# Patient Record
Sex: Female | Born: 1990 | Race: Black or African American | Hispanic: No | Marital: Single | Smoking: Never smoker
Health system: Southern US, Community
[De-identification: ages and names within clinical notes are randomized; demographics above are authoritative.]

## PROBLEM LIST (undated history)

## (undated) DIAGNOSIS — I10 Essential (primary) hypertension: Secondary | ICD-10-CM

## (undated) DIAGNOSIS — D649 Anemia, unspecified: Secondary | ICD-10-CM

## (undated) HISTORY — DX: Essential (primary) hypertension: I10

## (undated) HISTORY — DX: Anemia, unspecified: D64.9

---
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
## 2010-01-24 ENCOUNTER — Ambulatory Visit: Payer: Self-pay | Admitting: Obstetrics and Gynecology

## 2010-01-24 ENCOUNTER — Inpatient Hospital Stay (HOSPITAL_COMMUNITY): Admission: AD | Admit: 2010-01-24 | Discharge: 2010-01-24 | Payer: Self-pay | Admitting: Obstetrics

## 2010-06-01 ENCOUNTER — Emergency Department (HOSPITAL_COMMUNITY): Admission: EM | Admit: 2010-06-01 | Discharge: 2010-06-01 | Payer: Self-pay | Source: Home / Self Care

## 2010-06-19 ENCOUNTER — Inpatient Hospital Stay (HOSPITAL_COMMUNITY)
Admission: AD | Admit: 2010-06-19 | Discharge: 2010-06-24 | DRG: 765 | Disposition: A | Discharge status: Home / Self Care | Payer: Medicaid Other | Source: Ambulatory Visit | Attending: Obstetrics | Admitting: Obstetrics

## 2010-06-19 DIAGNOSIS — O480 Post-term pregnancy: Principal | ICD-10-CM | POA: Diagnosis present

## 2010-06-19 LAB — RPR: RPR Ser Ql: NONREACTIVE

## 2010-06-19 LAB — CBC
HCT: 36 % (ref 36.0–46.0)
MCH: 25.2 pg — ABNORMAL LOW (ref 26.0–34.0)
Platelets: 181 10*3/uL (ref 150–400)
RBC: 4.57 MIL/uL (ref 3.87–5.11)

## 2010-06-21 ENCOUNTER — Other Ambulatory Visit: Payer: Self-pay | Admitting: Obstetrics

## 2010-06-22 LAB — CBC
HCT: 25.4 % — ABNORMAL LOW (ref 36.0–46.0)
MCH: 25.8 pg — ABNORMAL LOW (ref 26.0–34.0)
MCHC: 33.1 g/dL (ref 30.0–36.0)
Platelets: 121 10*3/uL — ABNORMAL LOW (ref 150–400)
RDW: 14.5 % (ref 11.5–15.5)
WBC: 10 10*3/uL (ref 4.0–10.5)

## 2010-06-23 NOTE — Op Note (Signed)
[name], [name]               ACCOUNT NO.:  [id number]  MEDICAL RECORD NO.:  [id number]           PATIENT TYPE:  I  LOCATION:  9132                          [facility]:  [facility]  PHYSICIAN:  Charles A. Clearance Coots, M.D.DATE OF BIRTH:  March 11, 1991  DATE OF PROCEDURE:  06/21/2010 DATE OF DISCHARGE:                              OPERATIVE REPORT   PREOPERATIVE DIAGNOSES:  Postdates, induction of labor, and arrest of dilatation.  POSTOPERATIVE DIAGNOSES:  Postdates, induction of labor, and arrest of dilatation.  PROCEDURE:  Primary low transverse cesarean section.  SURGEON:  Charles A. Clearance Coots, M.D.  ANESTHESIA:  Epidural.  ESTIMATED BLOOD LOSS:  1000 mL.  IV FLUIDS:  2000 mL.  URINE:  100 mL.  COMPLICATIONS:  None.  DRAINS:  Foley to gravity.  FINDINGS:  Viable female at 1648, Apgars of 9 at one minute and 10 at five minutes, weight of 9 pounds 3 ounces.  Normal uterus, ovaries, and fallopian tubes.  SPECIMEN:  Placenta.  DISPOSITION OF SPECIMEN:  Pathology.  OPERATION:  The patient was brought to the operating room, and after satisfactory redosing of the epidural, the abdomen was prepped and draped in the usual sterile fashion.  A Pfannenstiel skin incision was made with a scalpel that was deepened down to the fascia with the scalpel.  Fascia was nicked in the midline, and the fascial incision was extended to the left and to the right with curved Mayo scissors.  The superior and inferior fascial edges were taken off the rectus muscle with both blunt and sharp dissection.  Rectus muscle was sharply divided in the midline, being careful to avoid the urinary bladder inferiorly. The peritoneum was entered digitally and was digitally extended to the left and to the right.  The Alexis retractor was then placed in the incision.  The vesicouterine fold of peritoneum above the reflection of the urinary bladder was grasped with forceps and was incised and undermined with Metzenbaum  scissors.  The incision was extended to the left and to the right with Metzenbaum scissors.  The bladder flap was bluntly developed.  The uterus was then entered transversely in the lower uterine segment with a scalpel.  Clear amniotic fluid was expelled.  The uterine incision was extended to the left and to the right with the bandage scissors.  The vertex was noted to be left occipitotransverse.  The occiput was rotated anteriorly to the incision, and the vertex was then delivered with the aid of fundal pressure from the assistant.  The infant's mouth and nose were suctioned with a suction bulb, and the delivery was completed with the aid of fundal pressure from the assistant.  The umbilical cord was doubly clamped and cut, and the infant was handed off to the nursery staff.  Cord blood was obtained, and the placenta was spontaneously expelled from the uterine cavity intact.  The endometrial surface was thoroughly debrided with a dry lap sponge.  The edges of the uterine incision were grasped with ring forceps.  The uterus was closed with a continuous interlocking suture of 0 Monocryl from each corner to the center.  A posterior extension  was also closed with continuous interlocking suture of 0 Monocryl.  Hemostasis was excellent.  Pelvic cavity was thoroughly irrigated with warm saline solution, and all clots were removed.  The abdomen was then closed as follows:  The parietal peritoneum was closed with a continuous suture of 2-0 Monocryl.  The fascia was closed with a continuous suture of 0 Vicryl.  Subcutaneous tissue was thoroughly irrigated with warm saline solution.  All areas of subcutaneous bleeding were coagulated with Bovie.  Skin was then closed with surgical stainless steel staples.  The surgical technician indicated that all needle, sponge, and instrument counts were correct x2.  The patient tolerated the procedure well, was transported to the recovery room in satisfactory  condition.     Charles A. Clearance Coots, M.D.     CAH/MEDQ  D:  06/21/2010  T:  06/22/2010  Job:  [id number]  Electronically Signed by Coral Ceo M.D. on 06/23/2010 09:55:54 AM

## 2010-06-25 NOTE — Discharge Summary (Signed)
  NAMETHULA, STEWART NO.:  [id number]  MEDICAL RECORD NO.:  [id number]           PATIENT TYPE:  I  LOCATION:  9132                          [facility]  PHYSICIAN:  Kathreen Cosier, M.D.DATE OF BIRTH:  1990-10-17  DATE OF ADMISSION:  06/19/2010 DATE OF DISCHARGE:  06/24/2010                              DISCHARGE SUMMARY   The patient is a 20 year old primigravida, [facility] June 13, 2010.  She was brought in for induction at 41 weeks.  Cervix of fingertip, 70%, vertex -2, -3.  She received Cytotec overnight on the night of admission of June 19, 2010, and on June 20, 2010, her cervix was 1 cm, 80%, vertex -2 to 3.  Membranes were ruptured artificially.  Her fluid was clear.  An IUPC was inserted and she was started on Pitocin.  The patient progressed slowly and then stopped progressing at 7 cm and at 0 station.  She underwent a primary low transverse cesarean section on June 21, 2010, for failure to progress in labor and she had a female, Apgars 8 and 9, weighing 9 pounds 3 ounces.  Postoperatively, the patient did well.  Her RPR was negative.  HIV negative and her hemoglobin was 8.4 postop.  She was discharged on the third postoperative day, ambulatory, on a regular diet, on Tylox for pain to see me in 6 weeks.  DISCHARGE DIAGNOSIS:  Status post primary low transverse cesarean section at 41 weeks for failure to progress in labor.          ______________________________ Kathreen Cosier, M.D.     BAM/MEDQ  D:  06/24/2010  T:  06/24/2010  Job:  [id number]  Electronically Signed by Francoise Ceo M.D. on 06/25/2010 08:16:01 AM

## 2010-07-01 ENCOUNTER — Inpatient Hospital Stay (HOSPITAL_COMMUNITY)
Admission: AD | Admit: 2010-07-01 | Discharge: 2010-07-01 | Disposition: A | Discharge status: Home / Self Care | Payer: Medicaid Other | Source: Ambulatory Visit | Attending: Obstetrics | Admitting: Obstetrics

## 2010-07-01 DIAGNOSIS — O909 Complication of the puerperium, unspecified: Secondary | ICD-10-CM | POA: Insufficient documentation

## 2010-07-17 LAB — BASIC METABOLIC PANEL
Calcium: 8.5 mg/dL (ref 8.4–10.5)
Potassium: 2.9 mEq/L — ABNORMAL LOW (ref 3.5–5.1)
Sodium: 135 mEq/L (ref 135–145)

## 2010-07-17 LAB — CBC
HCT: 32.5 % — ABNORMAL LOW (ref 36.0–46.0)
Hemoglobin: 10.8 g/dL — ABNORMAL LOW (ref 12.0–15.0)
RBC: 3.92 MIL/uL (ref 3.87–5.11)
RDW: 13.5 % (ref 11.5–15.5)
WBC: 6.8 10*3/uL (ref 4.0–10.5)

## 2011-01-03 ENCOUNTER — Ambulatory Visit (INDEPENDENT_AMBULATORY_CARE_PROVIDER_SITE_OTHER): Payer: Medicaid Other

## 2011-01-03 ENCOUNTER — Inpatient Hospital Stay (INDEPENDENT_AMBULATORY_CARE_PROVIDER_SITE_OTHER)
Admission: RE | Admit: 2011-01-03 | Discharge: 2011-01-03 | Disposition: A | Discharge status: Home / Self Care | Payer: Medicaid Other | Source: Ambulatory Visit | Attending: Emergency Medicine | Admitting: Emergency Medicine

## 2011-01-03 DIAGNOSIS — R071 Chest pain on breathing: Secondary | ICD-10-CM

## 2012-02-29 ENCOUNTER — Other Ambulatory Visit: Payer: Self-pay

## 2012-02-29 ENCOUNTER — Encounter (HOSPITAL_COMMUNITY): Payer: Self-pay | Admitting: Emergency Medicine

## 2012-02-29 ENCOUNTER — Emergency Department (HOSPITAL_COMMUNITY)
Admission: EM | Admit: 2012-02-29 | Discharge: 2012-02-29 | Disposition: A | Discharge status: Home / Self Care | Payer: Self-pay | Source: Home / Self Care | Attending: Emergency Medicine | Admitting: Emergency Medicine

## 2012-02-29 ENCOUNTER — Emergency Department (INDEPENDENT_AMBULATORY_CARE_PROVIDER_SITE_OTHER): Payer: Medicaid Other

## 2012-02-29 DIAGNOSIS — K2100 Gastro-esophageal reflux disease with esophagitis, without bleeding: Secondary | ICD-10-CM

## 2012-02-29 MED ORDER — OMEPRAZOLE 40 MG PO CPDR: 40.0000 mg | DELAYED_RELEASE_CAPSULE | Freq: Two times a day (BID) | ORAL | Status: DC

## 2012-02-29 NOTE — ED Notes (Addendum)
MD at bedside. 

## 2012-02-29 NOTE — ED Notes (Signed)
CHILD OF PATIENT IS BEING SEEN BY PHYSICIAN AS WELL

## 2012-02-29 NOTE — ED Notes (Signed)
REPORTS CENTER CHEST PAIN FOR ONE YEAR, PAIN HAS GONE AWAY DURING THAT TIME, BUT HAS REOCCURRED IN THE LAST MONTH.  PATIENT REPORTS PAIN LIKE SHE HAS BEEN SEEN FOR IN THE PAST

## 2012-02-29 NOTE — ED Provider Notes (Signed)
Chief Complaint  Patient presents with  . Chest Pain    History of Present Illness:   [name] is a 21 year old female who presents today with a one-year history of substernal chest pain. She was seen here for this a year ago for this same pain and an EKG was done. She was prescribed nonsteroidal anti-inflammatories for possible musculoskeletal pain. She did not take the medications and she was breast-feeding. The pain went away on its own after several months however. It came back about a month ago. The pain is constant and daily. It's localized to the substernal area without radiation. It is worse if she drinks a glass of wine, but also with walking and with breathing. She tried some Maalox and this did seem to help. She feels mildly short of breath at times. She denies any nausea or diaphoresis. She has had no fever or chills. No coughing, wheezing, or asthma history. No cardiac history, palpitations, dizziness, or syncope. No leg swelling or pain. She denies any indigestion, heartburn, waterbrash, or abdominal pain. The patient states she drinks a good bit of coffee at work and occasional alcohol. She does not use tobacco. She states she eats habanero peppers everyday.  Review of Systems:  Other than noted above, the patient denies any of the following symptoms. Systemic:  No fever, chills, sweats, or fatigue. ENT:  No nasal congestion, rhinorrhea, or sore throat. Pulmonary:  No cough, wheezing, shortness of breath, sputum production, hemoptysis. Cardiac:  No palpitations, rapid heartbeat, dizziness, presyncope or syncope. GI:  No abdominal pain, heartburn, nausea, or vomiting. Ext:  No leg pain or swelling.  PMFSH:  Past medical history, family history, social history, meds, and allergies were reviewed and updated as needed.   Physical Exam:   Vital signs:  BP 125/77  Pulse 80  Temp 98.6 F (37 C) (Oral)  Resp 16  SpO2 100%  LMP 02/19/2012 Gen:  Alert, oriented, in no distress, skin warm  and dry. Eye:  PERRL, lids and conjunctivas normal.  Sclera non-icteric. ENT:  Mucous membranes moist, pharynx clear. Neck:  Supple, no adenopathy or tenderness.  No JVD. Lungs:  Clear to auscultation, no wheezes, rales or rhonchi.  No respiratory distress. Heart:  Regular rhythm.  No gallops, murmers, clicks or rubs. Chest:  No chest wall tenderness. Abdomen:  Soft, nontender, no organomegaly or mass.  Bowel sounds normal.  No pulsatile abdominal mass or bruit. Ext:  No edema.  No calf tenderness and Homann's sign negative.  Pulses full and equal. Skin:  Warm and dry.  No rash.  Radiology:  Dg Chest 2 View  02/29/2012  *RADIOLOGY REPORT*  Clinical Data: Chest pain  CHEST - 2 VIEW  Comparison: 01/03/2011  Findings: Heart size is at the upper limits of normal.  Mediastinal shadows are normal.  Lungs are clear.  No effusions.  No bony abnormalities.  IMPRESSION: Normal chest   Original Report Authenticated By: Thomasenia Sales, M.D.    I reviewed the images independently and personally and concur with the radiologist's findings.  EKG:   Date: 02/29/2012  Rate: 74  Rhythm: normal sinus rhythm  QRS Axis: normal  Intervals: normal  ST/T Wave abnormalities: normal  Conduction Disutrbances:none  Narrative Interpretation: Normal sinus rhythm, normal EKG  Old EKG Reviewed: unchanged   Medications given in U[facility]:  She is not having any pain right now, so I did not give her any GI cocktail.  Assessment:  The encounter diagnosis was Reflux esophagitis.   Plan:  1.  The following meds were prescribed:   New Prescriptions   OMEPRAZOLE (PRILOSEC) 40 MG CAPSULE    Take 1 capsule (40 mg total) by mouth 2 (two) times daily before a meal.   2.  The patient was instructed in symptomatic care and handouts were given. She was instructed in an anti-reflux diet and is to followup with her primary care physician in 2 weeks. 3.  The patient was told to return if becoming worse in any way, if no better in 3  or 4 days, and given some red flag symptoms that would indicate earlier return.    Reuben Likes, MD 02/29/12 1352

## 2012-09-08 ENCOUNTER — Telehealth: Payer: Self-pay

## 2012-09-08 NOTE — Telephone Encounter (Signed)
Has not been seen for this, has never been seen here, not our patient. She is coming in for this. Advised we will be happy to see her.

## 2012-09-08 NOTE — Telephone Encounter (Signed)
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

## 2013-07-18 ENCOUNTER — Ambulatory Visit (INDEPENDENT_AMBULATORY_CARE_PROVIDER_SITE_OTHER): Payer: 59 | Admitting: Family Medicine

## 2013-07-18 ENCOUNTER — Ambulatory Visit: Payer: 59

## 2013-07-18 VITALS — BP 114/68 | HR 91 | Temp 98.4°F | Resp 17 | Ht 63.5 in | Wt 195.0 lb

## 2013-07-18 DIAGNOSIS — R7611 Nonspecific reaction to tuberculin skin test without active tuberculosis: Secondary | ICD-10-CM

## 2013-07-18 DIAGNOSIS — Z3200 Encounter for pregnancy test, result unknown: Secondary | ICD-10-CM

## 2013-07-18 LAB — POCT URINE PREGNANCY: PREG TEST UR: NEGATIVE

## 2013-07-18 NOTE — Patient Instructions (Signed)
1.  [facility] for follow-up of positive Tb skin testing.

## 2013-07-18 NOTE — Progress Notes (Addendum)
Subjective:  This chart was scribed for Maureen SimmerKristi Smith, MD by [name], Medical Scribe. This patient was seen in Room  11 and the patient's care was started at 7:36 PM.   Patient ID: [name], female    DOB: 04/05/1991, 23 y.o.   MRN: [id number]  HPI HPI Comments: [name] is a 23 y.o. 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  The patient denies cough, diaphoresis, and unexplained weight loss as associated symptoms.  She states that she was born in [place] and has never lived with anyone with TB.  The patient denies having a history of chronic health problems.  She states that she will get a check-up once a year if she is sexually active.  She states that she has a history of 2 cesarian sections and has regular periods.  The patient states that her last period was June 19, 2013.  She denies using any birth control.  She states that her parents are healthy.  The patient states that she has been in the [place] since [place].  She states that she has been married for two years and has one child who is 303 years of age.  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  The patient states that she is going to be working for [employer].  She denies any tobacco or drug use.  She states that she will drink red wine on occasion.      History reviewed. No pertinent past medical history. Past Surgical History  Procedure Laterality Date   Cesarean section     History reviewed. No pertinent family history. History   Social History   Marital Status: Single    Spouse Name: N/A    Number of Children: N/A   Years of Education: N/A   Occupational History   Not on file.   Social History Main Topics   Smoking status: Never Smoker    Smokeless tobacco: Not on file     Alcohol Use: Yes   Drug Use: No   Sexual Activity: Yes    Birth Control/ Protection: None   Other Topics Concern   Not on file   Social History Narrative   Marital status: married x 2 years; moved to [place] from [place].        Children;  One child (3yo)      Lives:  With husband, son.      Employment:  CMA school; works for [employer].      Tobacco: none       Alcohol: wine on weekends      Drugs:  none   No Known Allergies  Review of Systems  Constitutional: Negative for diaphoresis and unexpected weight change.  Respiratory: Negative for cough.      Objective:  Physical Exam  Nursing note and vitals reviewed. Constitutional: She is oriented to person, place, and time. She appears well-developed and well-nourished. No distress.  HENT:  Head: Normocephalic and atraumatic.  Eyes: EOM are normal.  Neck: Neck supple. No tracheal deviation present.  Cardiovascular: Normal rate, regular rhythm and normal heart sounds.  Exam reveals no gallop and no friction rub.   No murmur heard. Pulmonary/Chest: Effort normal and  breath sounds normal. No respiratory distress. She has no wheezes. She has no rales.  Musculoskeletal: Normal range of motion.  Neurological: She is alert and oriented to person, place, and time.  Skin: Skin is warm and dry.  Psychiatric: She has a normal mood and affect. Her behavior is normal.    Results for orders placed or performed in visit on 07/18/13  POCT urine pregnancy  Result Value Ref Range   Preg Test, Ur Negative     [facility] reading (PRIMARY) by  Dr. Katrinka Blazing.  CXR: NAD.    BP 114/68 mmHg   Pulse 91   Temp(Src) 98.4 F (36.9 C) (Oral)   Resp 17   Ht 5' 3.5" (1.613 m)   Wt 195 lb (88.451 kg)   BMI 34.00 kg/m2   SpO2 100%   LMP 06/19/2013 Assessment & Plan:  Recent conversion of TB skin test - Plan: DG Chest 1 View  Encounter for pregnancy test - Plan: POCT urine pregnancy   1. 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  Normal CXR.  Refer pt  to [facility] for complete evaluation.     No orders of the defined types were placed in this encounter.   I personally performed the services described in this documentation, which was scribed in my presence.  The recorded information has been reviewed and is accurate.  [name], M.D.  [facility] [address] [place], [place]  [address] [phone] phone [phone] fax

## 2013-07-18 NOTE — Progress Notes (Signed)
  Tuberculosis Risk Questionnaire  1. Yes [place] Were you born outside the [place] in one of the following parts of the world: [place], [place], [place], [place] or [place]?    2. No Have you traveled outside the [place] and lived for more than one month in one of the following parts of the world: [place], [place], [place], [place] or [place]?    3. No Do you have a compromised immune system such as from any of the following conditions:HIV/AIDS, organ or bone marrow transplantation, diabetes, immunosuppressive medicines (e.g. Prednisone, Remicaide), leukemia, lymphoma, cancer of the head or neck, gastrectomy or jejunal bypass, end-stage renal disease (on dialysis), or silicosis?     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    5. No Have you ever: injected illegal drugs, used crack cocaine, lived in a homeless shelter  or been in jail or prison?     6. No Have you ever been exposed to anyone with infectious tuberculosis?    Tuberculosis Symptom Questionnaire  Do you currently have any of the following symptoms?  1. No Unexplained cough lasting more than 3 weeks?   2. No Unexplained fever lasting more than 3 weeks.   3. No Night Sweats (sweating that leaves the bedclothes and sheets wet)     4. No Shortness of Breath   5. No Chest Pain   6. No Unintentional weight loss    7. No Unexplained fatigue (very tired for no reason)

## 2013-11-03 ENCOUNTER — Emergency Department (HOSPITAL_BASED_OUTPATIENT_CLINIC_OR_DEPARTMENT_OTHER)
Admission: EM | Admit: 2013-11-03 | Discharge: 2013-11-03 | Disposition: A | Discharge status: Home / Self Care | Payer: No Typology Code available for payment source | Attending: Emergency Medicine | Admitting: Emergency Medicine

## 2013-11-03 ENCOUNTER — Encounter (HOSPITAL_BASED_OUTPATIENT_CLINIC_OR_DEPARTMENT_OTHER): Payer: Self-pay | Admitting: Emergency Medicine

## 2013-11-03 DIAGNOSIS — S335XXA Sprain of ligaments of lumbar spine, initial encounter: Secondary | ICD-10-CM | POA: Insufficient documentation

## 2013-11-03 DIAGNOSIS — S39012A Strain of muscle, fascia and tendon of lower back, initial encounter: Secondary | ICD-10-CM

## 2013-11-03 DIAGNOSIS — Y9389 Activity, other specified: Secondary | ICD-10-CM | POA: Insufficient documentation

## 2013-11-03 DIAGNOSIS — IMO0002 Reserved for concepts with insufficient information to code with codable children: Secondary | ICD-10-CM | POA: Insufficient documentation

## 2013-11-03 DIAGNOSIS — Y92410 Unspecified street and highway as the place of occurrence of the external cause: Secondary | ICD-10-CM | POA: Insufficient documentation

## 2013-11-03 DIAGNOSIS — G44311 Acute post-traumatic headache, intractable: Secondary | ICD-10-CM

## 2013-11-03 DIAGNOSIS — S0990XA Unspecified injury of head, initial encounter: Secondary | ICD-10-CM | POA: Insufficient documentation

## 2013-11-03 DIAGNOSIS — Z79899 Other long term (current) drug therapy: Secondary | ICD-10-CM | POA: Insufficient documentation

## 2013-11-03 DIAGNOSIS — Z791 Long term (current) use of non-steroidal anti-inflammatories (NSAID): Secondary | ICD-10-CM | POA: Insufficient documentation

## 2013-11-03 MED ORDER — NAPROXEN 500 MG PO TABS: 500.0000 mg | ORAL_TABLET | Freq: Two times a day (BID) | ORAL | Status: DC

## 2013-11-03 MED ORDER — CYCLOBENZAPRINE HCL 10 MG PO TABS: 10.0000 mg | ORAL_TABLET | Freq: Two times a day (BID) | ORAL | Status: DC | PRN

## 2013-11-03 MED ORDER — IBUPROFEN 400 MG PO TABS
600.0000 mg | ORAL_TABLET | Freq: Once | ORAL | Status: AC
  Administered 2013-11-03: 600 mg via ORAL
  Filled 2013-11-03 (×2): qty 1
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
## 2014-03-22 ENCOUNTER — Encounter (HOSPITAL_COMMUNITY): Payer: Self-pay | Admitting: Emergency Medicine

## 2014-03-22 ENCOUNTER — Emergency Department (HOSPITAL_COMMUNITY)
Admission: EM | Admit: 2014-03-22 | Discharge: 2014-03-22 | Discharge status: Against Medical Advice | Payer: 59 | Attending: Emergency Medicine | Admitting: Emergency Medicine

## 2014-03-22 DIAGNOSIS — R42 Dizziness and giddiness: Secondary | ICD-10-CM | POA: Diagnosis not present

## 2014-03-22 DIAGNOSIS — R102 Pelvic and perineal pain: Secondary | ICD-10-CM | POA: Diagnosis present

## 2014-03-22 LAB — CBC WITH DIFFERENTIAL/PLATELET
Basophils Absolute: 0 10*3/uL (ref 0.0–0.1)
Basophils Relative: 0 % (ref 0–1)
EOS PCT: 2 % (ref 0–5)
Eosinophils Absolute: 0.1 10*3/uL (ref 0.0–0.7)
HCT: 35.3 % — ABNORMAL LOW (ref 36.0–46.0)
Hemoglobin: 11.4 g/dL — ABNORMAL LOW (ref 12.0–15.0)
LYMPHS PCT: 34 % (ref 12–46)
Lymphs Abs: 1.8 10*3/uL (ref 0.7–4.0)
MCH: 24.8 pg — AB (ref 26.0–34.0)
MCHC: 32.3 g/dL (ref 30.0–36.0)
MCV: 76.7 fL — ABNORMAL LOW (ref 78.0–100.0)
MONO ABS: 0.3 10*3/uL (ref 0.1–1.0)
Monocytes Relative: 5 % (ref 3–12)
NEUTROS ABS: 3.2 10*3/uL (ref 1.7–7.7)
Neutrophils Relative %: 59 % (ref 43–77)
Platelets: 236 10*3/uL (ref 150–400)
RBC: 4.6 MIL/uL (ref 3.87–5.11)
RDW: 14.6 % (ref 11.5–15.5)
WBC: 5.4 10*3/uL (ref 4.0–10.5)

## 2014-03-22 LAB — COMPREHENSIVE METABOLIC PANEL
ALT: 19 U/L (ref 0–35)
AST: 19 U/L (ref 0–37)
Albumin: 3.9 g/dL (ref 3.5–5.2)
Alkaline Phosphatase: 56 U/L (ref 39–117)
Anion gap: 14 (ref 5–15)
BUN: 12 mg/dL (ref 6–23)
CALCIUM: 9.5 mg/dL (ref 8.4–10.5)
CHLORIDE: 104 meq/L (ref 96–112)
CO2: 23 mEq/L (ref 19–32)
Creatinine, Ser: 0.59 mg/dL (ref 0.50–1.10)
GLUCOSE: 118 mg/dL — AB (ref 70–99)
Potassium: 3.5 mEq/L — ABNORMAL LOW (ref 3.7–5.3)
SODIUM: 141 meq/L (ref 137–147)
TOTAL PROTEIN: 7.6 g/dL (ref 6.0–8.3)
Total Bilirubin: 0.2 mg/dL — ABNORMAL LOW (ref 0.3–1.2)

## 2014-03-22 NOTE — ED Notes (Addendum)
Pt reports sudden onset lower abdominal pain and pelvic pain when she laid down at 2pm today. Denies nvd. Does report being lightheaded intermittently. sts menstrual cycle coming on every other week since last month with these abdominal pains.

## 2014-03-22 NOTE — ED Notes (Signed)
Pt. Had to leave to pick up her child in day care.

## 2015-09-18 ENCOUNTER — Ambulatory Visit (INDEPENDENT_AMBULATORY_CARE_PROVIDER_SITE_OTHER): Payer: Self-pay | Admitting: Family Medicine

## 2015-09-18 VITALS — BP 128/86 | HR 73 | Temp 98.3°F | Resp 16 | Ht 63.5 in | Wt 194.0 lb

## 2015-09-18 DIAGNOSIS — M25511 Pain in right shoulder: Secondary | ICD-10-CM

## 2015-09-18 MED ORDER — PREDNISONE 20 MG PO TABS: ORAL_TABLET | ORAL | Status: DC

## 2015-09-18 NOTE — Progress Notes (Signed)
25 yo woman who works for [employer] x 3 weeks.  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  She has to do 8 pieces every minute.  Has tried ibuprofen with no improvement. She has to drive 1 and 1/2  Each way.  No pain in wrist or hand   Objective:  NAD Unable to abduct arm over shoulder height because of pain.  Good rotational movement of arm.  Tender lower anterior joint of right shoulder BP 128/86 mmHg  Pulse 73  Temp(Src) 98.3 F (36.8 C)  Resp 16  Ht 5' 3.5" (1.613 m)  Wt 194 lb (87.998 kg)  BMI 33.82 kg/m2 Right  Handed  Assessment:  Repetitive motion problem  Plan:  Out of work until Friday Prednisone 20 bid x 5 days  Elvina SidleKurt Jalexis Breed, MD

## 2015-09-18 NOTE — Patient Instructions (Addendum)
Tendonitis from overuse of the shoulder.  Should resolve in 2-3 days. If not, please return

## 2015-10-07 ENCOUNTER — Encounter (HOSPITAL_COMMUNITY): Payer: Self-pay | Admitting: Emergency Medicine

## 2015-10-07 ENCOUNTER — Emergency Department (HOSPITAL_COMMUNITY)
Admission: EM | Admit: 2015-10-07 | Discharge: 2015-10-08 | Disposition: A | Discharge status: Home / Self Care | Payer: No Typology Code available for payment source | Attending: Emergency Medicine | Admitting: Emergency Medicine

## 2015-10-07 DIAGNOSIS — Y998 Other external cause status: Secondary | ICD-10-CM | POA: Insufficient documentation

## 2015-10-07 DIAGNOSIS — Y92410 Unspecified street and highway as the place of occurrence of the external cause: Secondary | ICD-10-CM | POA: Insufficient documentation

## 2015-10-07 DIAGNOSIS — Z7952 Long term (current) use of systemic steroids: Secondary | ICD-10-CM | POA: Diagnosis not present

## 2015-10-07 DIAGNOSIS — R0602 Shortness of breath: Secondary | ICD-10-CM | POA: Diagnosis not present

## 2015-10-07 DIAGNOSIS — Y9389 Activity, other specified: Secondary | ICD-10-CM | POA: Diagnosis not present

## 2015-10-07 DIAGNOSIS — S3992XA Unspecified injury of lower back, initial encounter: Secondary | ICD-10-CM | POA: Diagnosis not present

## 2015-10-07 DIAGNOSIS — M545 Low back pain, unspecified: Secondary | ICD-10-CM

## 2015-10-07 DIAGNOSIS — Z79899 Other long term (current) drug therapy: Secondary | ICD-10-CM | POA: Diagnosis not present

## 2015-10-07 LAB — POC URINE PREG, ED: PREG TEST UR: NEGATIVE

## 2015-10-07 MED ORDER — NAPROXEN 250 MG PO TABS
500.0000 mg | ORAL_TABLET | Freq: Once | ORAL | Status: AC
  Administered 2015-10-07: 500 mg via ORAL
  Filled 2015-10-07: qty 2
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
## 2015-10-08 ENCOUNTER — Emergency Department (HOSPITAL_COMMUNITY): Payer: No Typology Code available for payment source

## 2015-10-08 MED ORDER — IBUPROFEN 800 MG PO TABS: 800.0000 mg | ORAL_TABLET | Freq: Three times a day (TID) | ORAL | Status: DC

## 2015-10-08 MED ORDER — CYCLOBENZAPRINE HCL 10 MG PO TABS: 10.0000 mg | ORAL_TABLET | Freq: Every day | ORAL | Status: DC

## 2015-10-08 NOTE — Discharge Instructions (Signed)

## 2017-03-16 ENCOUNTER — Ambulatory Visit: Payer: Self-pay | Admitting: Nurse Practitioner

## 2020-01-03 ENCOUNTER — Encounter: Payer: Self-pay | Admitting: Obstetrics & Gynecology

## 2020-02-14 ENCOUNTER — Encounter: Payer: Self-pay | Admitting: Obstetrics & Gynecology

## 2020-02-14 ENCOUNTER — Other Ambulatory Visit: Payer: Self-pay

## 2020-02-14 ENCOUNTER — Ambulatory Visit (INDEPENDENT_AMBULATORY_CARE_PROVIDER_SITE_OTHER): Payer: 59 | Admitting: Obstetrics & Gynecology

## 2020-02-14 VITALS — BP 162/109 | HR 80 | Wt 209.4 lb

## 2020-02-14 DIAGNOSIS — I10 Essential (primary) hypertension: Secondary | ICD-10-CM | POA: Diagnosis not present

## 2020-02-14 DIAGNOSIS — D500 Iron deficiency anemia secondary to blood loss (chronic): Secondary | ICD-10-CM

## 2020-02-14 DIAGNOSIS — N939 Abnormal uterine and vaginal bleeding, unspecified: Secondary | ICD-10-CM | POA: Diagnosis not present

## 2020-02-14 MED ORDER — MEGESTROL ACETATE 40 MG PO TABS: 40.0000 mg | ORAL_TABLET | Freq: Every day | ORAL | 5 refills | Status: DC

## 2020-02-14 MED ORDER — HYDROCHLOROTHIAZIDE 25 MG PO TABS: 25.0000 mg | ORAL_TABLET | Freq: Every day | ORAL | 3 refills | Status: DC

## 2020-02-14 NOTE — Progress Notes (Addendum)
PHQ-9 and GAD-7 positive today, denies SI. Pt reports some overwhelm due to psychosocial stressors. Offered [facility] services. Pt agrees to follow up with office if she is interested in speaking with [facility].   Fleet Contras RN 02/14/20

## 2020-02-14 NOTE — Progress Notes (Signed)
GYNECOLOGY OFFICE VISIT NOTE  History:   [name] is a 29 y.o. G1P1001 here today referred from [facility] for evaluation of abnormal and heavy vaginal bleeding.  Has been ongoing for several months, was initially seen at [facility] in 08/2019, she has normal pap smear and was started on OCPs. Unable to get ultrasound then due to cost. This helped a little but she traveled home to [facility] and was unable to take her pills. She was there for over a month, reported bleeding every day.  Still had heavy irregular bleeding since then, can be heavy enough to change pads every hour.  Reports having about two periods a month, lasting 5-7 days but also some intervening bleeding. Currently spotting.  She is not pregnant, but wants to try to get pregnant in the next year. Husband is still in [place]. Reports feeling tired sometimes.  Also has headaches but is worried about her blood pressure, she was told that it was really elevated at [facility] and she has appointment to see PCP at [facility] next week.  She denies any abnormal vaginal discharge, pelvic pain or other concerns.    History reviewed. No pertinent past medical history.  Past Surgical History:  Procedure Laterality Date  . CESAREAN SECTION      The following portions of the patient's history were reviewed and updated as appropriate: allergies, current medications, past family history, past medical history, past social history, past surgical history and problem list.   Health Maintenance:  Normal pap on 08/23/2019.  Review of Systems:  Pertinent items noted in HPI and remainder of comprehensive ROS otherwise negative.  Physical Exam:  BP (!) 162/109   Pulse 80   Wt 209 lb 6.4 oz (95 kg)   LMP 01/30/2020 (Approximate)   BMI 37.09 kg/m  CONSTITUTIONAL: Well-developed, well-nourished female in no acute distress.  HEENT:  Normocephalic, atraumatic. External right and left ear normal. No scleral icterus.  NECK: Normal range of motion, supple, no masses noted on  observation SKIN: No rash noted. Not diaphoretic. No erythema. No pallor. MUSCULOSKELETAL: Normal range of motion. No edema noted. NEUROLOGIC: Alert and oriented to person, place, and time. Normal muscle tone coordination. No cranial nerve deficit noted. PSYCHIATRIC: Normal mood and affect. Normal behavior. Normal judgment and thought content. CARDIOVASCULAR: Normal heart rate noted RESPIRATORY: Effort and breath sounds normal, no problems with respiration noted ABDOMEN: No masses noted. No other overt distention noted.   PELVIC: Deferred     Assessment and Plan:      1. Hypertension, unspecified type Severe BP precautions reviewed with patient. Will start HCTZ for now, she will follow up with PCP at [facility] for further management. - hydrochlorothiazide (HYDRODIURIL) 25 MG tablet; Take 1 tablet (25 mg total) by mouth daily.  Dispense: 30 tablet; Refill: 3  2. Abnormal uterine bleeding (AUB) Labs and ultrasound ordered.  Discussed management modalities, high BP precludes estrogen therapy for now.  Megace prescribed, bleeding precautions advised. Will decide about long term management depending on results of studies. - CBC - Ferritin  - TSH - US PELVIC COMPLETE WITH TRANSVAGINAL; Future - megestrol (MEGACE) 40 MG tablet; Take 1 tablet (40 mg total) by mouth daily. Can increase to one tablet twice a day in the event of heavy bleeding  Dispense: 60 tablet; Refill: 5  Routine preventative health maintenance measures emphasized. Please refer to After Visit Summary for other counseling recommendations.   Return in about 3 weeks (around 03/06/2020) for Follow up visit after ultrasound.  Total face-to-face time with patient: 30 minutes.  Over 50% of encounter was spent on counseling and coordination of care.   Jaynie Collins, MD, FACOG Obstetrician & Gynecologist, [facility] for [employer], [facility]

## 2020-02-14 NOTE — Patient Instructions (Signed)
Abnormal Uterine Bleeding °Abnormal uterine bleeding is unusual bleeding from the uterus. It includes: °· Bleeding or spotting between periods. °· Bleeding after sex. °· Bleeding that is heavier than normal. °· Periods that last longer than usual. °· Bleeding after menopause. °Abnormal uterine bleeding can affect women at various stages in life, including teenagers, women in their reproductive years, pregnant women, and women who have reached menopause. Common causes of abnormal uterine bleeding include: °· Pregnancy. °· Growths of tissue (polyps). °· A noncancerous tumor in the uterus (fibroid). °· Infection. °· Cancer. °· Hormonal imbalances. °Any type of abnormal bleeding should be evaluated by a health care provider. Many cases are minor and simple to treat, while others are more serious. Treatment will depend on the cause of the bleeding. °Follow these instructions at home: °· Monitor your condition for any changes. °· Do not use tampons, douche, or have sex if told by your health care provider. °· Change your pads often. °· Get regular exams that include pelvic exams and cervical cancer screening. °· Keep all follow-up visits as told by your health care provider. This is important. °Contact a health care provider if: °· Your bleeding lasts for more than one week. °· You feel dizzy at times. °· You feel nauseous or you vomit. °Get help right away if: °· You pass out. °· Your bleeding soaks through a pad every hour. °· You have abdominal pain. °· You have a fever. °· You become sweaty or weak. °· You pass large blood clots from your vagina. °Summary °· Abnormal uterine bleeding is unusual bleeding from the uterus. °· Any type of abnormal bleeding should be evaluated by a health care provider. Many cases are minor and simple to treat, while others are more serious. °· Treatment will depend on the cause of the bleeding. °This information is not intended to replace advice given to you by your health care provider.  Make sure you discuss any questions you have with your health care provider. °Document Revised: 07/28/2017 Document Reviewed: 05/22/2016 °Elsevier Patient Education © 2020 Elsevier Inc. ° °

## 2020-02-15 ENCOUNTER — Encounter: Payer: Self-pay | Admitting: Obstetrics & Gynecology

## 2020-02-15 ENCOUNTER — Other Ambulatory Visit: Payer: Self-pay

## 2020-02-15 DIAGNOSIS — D500 Iron deficiency anemia secondary to blood loss (chronic): Secondary | ICD-10-CM | POA: Insufficient documentation

## 2020-02-15 LAB — CBC
Hematocrit: 35.3 % (ref 34.0–46.6)
Hemoglobin: 10.8 g/dL — ABNORMAL LOW (ref 11.1–15.9)
MCH: 21.2 pg — ABNORMAL LOW (ref 26.6–33.0)
MCHC: 30.6 g/dL — ABNORMAL LOW (ref 31.5–35.7)
MCV: 69 fL — ABNORMAL LOW (ref 79–97)
Platelets: 288 10*3/uL (ref 150–450)
RBC: 5.1 x10E6/uL (ref 3.77–5.28)
RDW: 20.8 % — ABNORMAL HIGH (ref 11.7–15.4)
WBC: 5.4 10*3/uL (ref 3.4–10.8)

## 2020-02-15 LAB — TSH: TSH: 1.27 u[IU]/mL (ref 0.450–4.500)

## 2020-02-15 LAB — FERRITIN: Ferritin: 8 ng/mL — ABNORMAL LOW (ref 15–150)

## 2020-02-15 MED ORDER — FERROUS SULFATE 325 (65 FE) MG PO TABS: 325.0000 mg | ORAL_TABLET | Freq: Two times a day (BID) | ORAL | 1 refills | Status: DC

## 2020-02-15 NOTE — Progress Notes (Signed)
Pt requests iron supplement instead of iron transfusion at this time. Iron supplement sent to pt preferred pharmacy, pt made aware.

## 2020-02-15 NOTE — Addendum Note (Signed)
Addended by: Jaynie Collins A on: 02/15/2020 08:34 AM   Modules accepted: Orders

## 2020-02-20 ENCOUNTER — Ambulatory Visit (INDEPENDENT_AMBULATORY_CARE_PROVIDER_SITE_OTHER): Payer: 59 | Admitting: Registered Nurse

## 2020-02-20 ENCOUNTER — Other Ambulatory Visit: Payer: Self-pay

## 2020-02-20 ENCOUNTER — Encounter: Payer: Self-pay | Admitting: Registered Nurse

## 2020-02-20 VITALS — BP 158/112 | HR 94 | Temp 98.3°F | Ht 63.0 in | Wt 208.9 lb

## 2020-02-20 DIAGNOSIS — Z7689 Persons encountering health services in other specified circumstances: Secondary | ICD-10-CM

## 2020-02-20 DIAGNOSIS — F418 Other specified anxiety disorders: Secondary | ICD-10-CM

## 2020-02-20 DIAGNOSIS — D500 Iron deficiency anemia secondary to blood loss (chronic): Secondary | ICD-10-CM | POA: Diagnosis not present

## 2020-02-20 DIAGNOSIS — Z130 Encounter for screening for diseases of the blood and blood-forming organs and certain disorders involving the immune mechanism: Secondary | ICD-10-CM

## 2020-02-20 DIAGNOSIS — Z1329 Encounter for screening for other suspected endocrine disorder: Secondary | ICD-10-CM

## 2020-02-20 DIAGNOSIS — Z13220 Encounter for screening for lipoid disorders: Secondary | ICD-10-CM | POA: Diagnosis not present

## 2020-02-20 DIAGNOSIS — Z13228 Encounter for screening for other metabolic disorders: Secondary | ICD-10-CM

## 2020-02-20 DIAGNOSIS — I889 Nonspecific lymphadenitis, unspecified: Secondary | ICD-10-CM

## 2020-02-20 MED ORDER — FLUOXETINE HCL 20 MG PO CAPS: 20.0000 mg | ORAL_CAPSULE | Freq: Every day | ORAL | 0 refills | Status: DC

## 2020-02-20 NOTE — Patient Instructions (Signed)
° ° ° °  If you have lab work done today you will be contacted with your lab results within the next 2 weeks.  If you have not heard from us then please contact us. The fastest way to get your results is to register for My Chart. ° ° °IF you received an x-ray today, you will receive an invoice from [facility]. Please contact [facility] at [phone] with questions or concerns regarding your invoice.  ° °IF you received labwork today, you will receive an invoice from [facility]. Please contact [facility] at [phone] with questions or concerns regarding your invoice.  ° °Our billing staff will not be able to assist you with questions regarding bills from these companies. ° °You will be contacted with the lab results as soon as they are available. The fastest way to get your results is to activate your My Chart account. Instructions are located on the last page of this paperwork. If you have not heard from us regarding the results in 2 weeks, please contact this office. °  ° ° ° °

## 2020-02-21 LAB — COMPREHENSIVE METABOLIC PANEL
ALT: 12 IU/L (ref 0–32)
AST: 12 IU/L (ref 0–40)
Albumin/Globulin Ratio: 1.3 (ref 1.2–2.2)
Albumin: 4.2 g/dL (ref 3.9–5.0)
Alkaline Phosphatase: 66 IU/L (ref 44–121)
BUN/Creatinine Ratio: 15 (ref 9–23)
BUN: 8 mg/dL (ref 6–20)
Bilirubin Total: 0.3 mg/dL (ref 0.0–1.2)
CO2: 20 mmol/L (ref 20–29)
Calcium: 8.8 mg/dL (ref 8.7–10.2)
Chloride: 103 mmol/L (ref 96–106)
Creatinine, Ser: 0.54 mg/dL — ABNORMAL LOW (ref 0.57–1.00)
GFR calc Af Amer: 146 mL/min/{1.73_m2} (ref >59)
GFR calc non Af Amer: 127 mL/min/{1.73_m2} (ref >59)
Globulin, Total: 3.2 g/dL (ref 1.5–4.5)
Glucose: 98 mg/dL (ref 65–99)
Potassium: 4.2 mmol/L (ref 3.5–5.2)
Sodium: 140 mmol/L (ref 134–144)
Total Protein: 7.4 g/dL (ref 6.0–8.5)

## 2020-02-21 LAB — CBC WITH DIFFERENTIAL
Basophils Absolute: 0 10*3/uL (ref 0.0–0.2)
Basos: 1 %
EOS (ABSOLUTE): 0.2 10*3/uL (ref 0.0–0.4)
Eos: 3 %
Hematocrit: 37.3 % (ref 34.0–46.6)
Hemoglobin: 11.2 g/dL (ref 11.1–15.9)
Immature Grans (Abs): 0 10*3/uL (ref 0.0–0.1)
Immature Granulocytes: 0 %
Lymphocytes Absolute: 2.4 10*3/uL (ref 0.7–3.1)
Lymphs: 43 %
MCH: 21.4 pg — ABNORMAL LOW (ref 26.6–33.0)
MCHC: 30 g/dL — ABNORMAL LOW (ref 31.5–35.7)
MCV: 71 fL — ABNORMAL LOW (ref 79–97)
Monocytes Absolute: 0.4 10*3/uL (ref 0.1–0.9)
Monocytes: 8 %
Neutrophils Absolute: 2.6 10*3/uL (ref 1.4–7.0)
Neutrophils: 45 %
RBC: 5.24 x10E6/uL (ref 3.77–5.28)
RDW: 20.8 % — ABNORMAL HIGH (ref 11.7–15.4)
WBC: 5.6 10*3/uL (ref 3.4–10.8)

## 2020-02-21 LAB — LIPID PANEL
Chol/HDL Ratio: 3.2 ratio (ref 0.0–4.4)
Cholesterol, Total: 126 mg/dL (ref 100–199)
HDL: 40 mg/dL (ref >39)
LDL Chol Calc (NIH): 62 mg/dL (ref 0–99)
Triglycerides: 133 mg/dL (ref 0–149)
VLDL Cholesterol Cal: 24 mg/dL (ref 5–40)

## 2020-02-21 LAB — HEMOGLOBIN A1C
Est. average glucose Bld gHb Est-mCnc: 114 mg/dL
Hgb A1c MFr Bld: 5.6 % (ref 4.8–5.6)

## 2020-02-21 LAB — TSH: TSH: 1.38 u[IU]/mL (ref 0.450–4.500)

## 2020-02-21 NOTE — Progress Notes (Signed)
Good morning,  Normal results letter, please!  Thanks,  Rich Niyana Chesbro, NP

## 2020-02-27 ENCOUNTER — Other Ambulatory Visit: Payer: Self-pay

## 2020-02-27 ENCOUNTER — Ambulatory Visit
Admission: RE | Admit: 2020-02-27 | Discharge: 2020-02-27 | Disposition: A | Discharge status: Home / Self Care | Payer: 59 | Source: Ambulatory Visit | Attending: Obstetrics & Gynecology | Admitting: Obstetrics & Gynecology

## 2020-02-27 DIAGNOSIS — N939 Abnormal uterine and vaginal bleeding, unspecified: Secondary | ICD-10-CM | POA: Diagnosis present

## 2020-02-28 ENCOUNTER — Other Ambulatory Visit: Payer: Self-pay | Admitting: Registered Nurse

## 2020-02-28 DIAGNOSIS — I889 Nonspecific lymphadenitis, unspecified: Secondary | ICD-10-CM

## 2020-03-01 ENCOUNTER — Telehealth: Payer: Self-pay | Admitting: Lactation Services

## 2020-03-01 NOTE — Telephone Encounter (Signed)
-----   Message from Maureen Newcomer, MD sent at 02/28/2020  3:22 PM EDT ----- Possible adenomyosis, this can be associated with abnormal uterine bleeding and pain. Usually treated with medication such as her prescribed Megace (especially when trying to preserve fertility), or hysterectomy in severe cases. Not concerned about small paraovarian cyst, this is physiologic.  Please call to inform patient of results.

## 2020-03-01 NOTE — Telephone Encounter (Signed)
Called patient to give her results of the [place] and to review continuing to take Megace as prescribed. Patient informed she has a follow up appt on 11/3 at 2:35 to discuss further. Questions answered and patient to follow up in office next week.

## 2020-03-06 ENCOUNTER — Ambulatory Visit: Payer: 59 | Admitting: Family Medicine

## 2020-03-18 ENCOUNTER — Ambulatory Visit
Admission: RE | Admit: 2020-03-18 | Discharge: 2020-03-18 | Disposition: A | Discharge status: Home / Self Care | Payer: 59 | Source: Ambulatory Visit | Attending: Registered Nurse | Admitting: Registered Nurse

## 2020-03-18 ENCOUNTER — Other Ambulatory Visit: Payer: Self-pay

## 2020-03-18 ENCOUNTER — Other Ambulatory Visit: Payer: Self-pay | Admitting: Registered Nurse

## 2020-03-18 DIAGNOSIS — N6310 Unspecified lump in the right breast, unspecified quadrant: Secondary | ICD-10-CM

## 2020-03-18 DIAGNOSIS — I889 Nonspecific lymphadenitis, unspecified: Secondary | ICD-10-CM

## 2020-03-20 ENCOUNTER — Ambulatory Visit: Payer: 59 | Admitting: Obstetrics & Gynecology

## 2020-04-21 ENCOUNTER — Encounter: Payer: Self-pay | Admitting: Registered Nurse

## 2020-04-21 NOTE — Progress Notes (Signed)
New Patient Office Visit  Subjective:  Patient ID: [name], female    DOB: 05-31-1990  Age: 29 y.o. MRN: [id number]  CC:  Chief Complaint  Patient presents with  . New Patient (Initial Visit)    wants to talk about anxiety and the lymph growing under left arm    HPI [name] presents for visit to est care  Axillary adenopathy: ongoing for a few weeks. Tenderness and swelling. No further breast symptoms. No recent infection. No known family hx of breast ca.  Anxiety and depression: ongoing. Denies hi/si. Interested in options for treatment. Willing to discuss medication and therapy as options.  Otherwise feeling well. Histories reviewed and updated as warranted.  Past Medical History:  Diagnosis Date  . Anemia    Phreesia 02/19/2020  . Hypertension    Phreesia 02/19/2020    Past Surgical History:  Procedure Laterality Date  . CESAREAN SECTION  2012    Family History  Problem Relation Age of Onset  . Hypertension Mother     Social History   Socioeconomic History  . Marital status: Single    Spouse name: Not on file  . Number of children: 1  . Years of education: Not on file  . Highest education level: Not on file  Occupational History  . Not on file  Tobacco Use  . Smoking status: Never Smoker  . Smokeless tobacco: Never Used  Vaping Use  . Vaping Use: Never used  Substance and Sexual Activity  . Alcohol use: Yes  . Drug use: No  . Sexual activity: Yes    Birth control/protection: None  Other Topics Concern  . Not on file  Social History Narrative   Marital status: engaged; moved to [place] from [place] in 2000.        Children;  One child (9yo)      Lives:  With husband, son.      Employment:  [employer], studies sociology      Tobacco: none       Alcohol: wine on weekends      Drugs:  none   Social Determinants of Corporate investment banker Strain: Not on file  Food Insecurity: No Food Insecurity  . Worried About Brewing technologist in the Last Year: Never true  . Ran Out of Food in the Last Year: Never true  Transportation Needs: No Transportation Needs  . Lack of Transportation (Medical): No  . Lack of Transportation (Non-Medical): No  Physical Activity: Not on file  Stress: Not on file  Social Connections: Not on file  Intimate Partner Violence: Not on file    ROS Review of Systems  Constitutional: Negative.   HENT: Negative.   Eyes: Negative.   Respiratory: Negative.   Cardiovascular: Negative.   Gastrointestinal: Negative.   Genitourinary: Negative.   Musculoskeletal: Negative.   Skin: Negative.   Neurological: Negative.   Psychiatric/Behavioral: Negative.   All other systems reviewed and are negative.   Objective:   Today's Vitals: BP (!) 158/112   Pulse 94   Temp 98.3 F (36.8 C)   Ht 5\' 3"  (1.6 m)   Wt 208 lb 14.4 oz (94.8 kg)   LMP 01/30/2020 (Approximate)   SpO2 100%   BMI 37.00 kg/m   Physical Exam Vitals and nursing note reviewed.  Constitutional:      Appearance: Normal appearance.  Cardiovascular:     Rate and Rhythm: Normal rate and regular rhythm.  Pulmonary:  Effort: Pulmonary effort is normal. No respiratory distress.  Chest:  Breasts:     Right: Normal. No axillary adenopathy or supraclavicular adenopathy.     Left: Normal. Axillary adenopathy present. No supraclavicular adenopathy.    Lymphadenopathy:     Upper Body:     Right upper body: No supraclavicular, axillary or pectoral adenopathy.     Left upper body: Axillary adenopathy present. No supraclavicular or pectoral adenopathy.  Skin:    General: Skin is warm and dry.  Neurological:     General: No focal deficit present.     Mental Status: She is alert and oriented to person, place, and time. Mental status is at baseline.  Psychiatric:        Mood and Affect: Mood normal.        Behavior: Behavior normal.        Thought Content: Thought content normal.        Judgment: Judgment normal.      Assessment & Plan:   Problem List Items Addressed This Visit      Other   Iron deficiency anemia due to chronic menstrual blood loss   Relevant Orders   CBC With Differential (Completed)    Other Visit Diagnoses    Encounter to establish care    -  Primary   Screening for endocrine, metabolic and immunity disorder       Relevant Orders   CBC With Differential (Completed)   Comprehensive metabolic panel (Completed)   TSH (Completed)   Hemoglobin A1c (Completed)   Lipid screening       Relevant Orders   Lipid panel (Completed)   Axillary lymphadenitis       Relevant Orders   [place] AXILLA LEFT (Completed)   Anxiety with depression       Relevant Medications   FLUoxetine (PROZAC) 20 MG capsule   Other Relevant Orders   Ambulatory referral to Psychology      Outpatient Encounter Medications as of 02/20/2020  Medication Sig  . cyclobenzaprine (FLEXERIL) 10 MG tablet Take 1 tablet (10 mg total) by mouth at bedtime.  . ferrous sulfate (FERROUSUL) 325 (65 FE) MG tablet Take 1 tablet (325 mg total) by mouth 2 (two) times daily.  . hydrochlorothiazide (HYDRODIURIL) 25 MG tablet Take 1 tablet (25 mg total) by mouth daily.  Marland Kitchen ibuprofen (ADVIL,MOTRIN) 800 MG tablet Take 1 tablet (800 mg total) by mouth 3 (three) times daily.  . megestrol (MEGACE) 40 MG tablet Take 1 tablet (40 mg total) by mouth daily. Can increase to one tablet twice a day in the event of heavy bleeding  . [DISCONTINUED] predniSONE (DELTASONE) 20 MG tablet Two daily with food  . FLUoxetine (PROZAC) 20 MG capsule Take 1 capsule (20 mg total) by mouth daily.   No facility-administered encounter medications on file as of 02/20/2020.    Follow-up: No follow-ups on file.   PLAN  Start fluoxetine 20mg  PO qd. Return in 5-6 weeks for med check. Can titrate up if improving. Discussed r/b/se of this medication  Adenopathy of some concern, reassuring that patient has no further symptoms and negative fam hx to her  knowledge. Will order .  Labs collected. Will follow up with the patient as warranted.  Patient encouraged to call clinic with any questions, comments, or concerns.  [place], NP

## 2020-04-22 ENCOUNTER — Telehealth: Payer: Self-pay | Admitting: Registered Nurse

## 2020-04-22 NOTE — Telephone Encounter (Signed)
Patient has been taking   megestrol (MEGACE) 40 MG tablet [[id number]]   Still having heavy bleeding for 3 weeks: having fatigue.  Please advise at [phone]

## 2020-04-23 ENCOUNTER — Telehealth: Payer: Self-pay | Admitting: *Deleted

## 2020-04-23 NOTE — Telephone Encounter (Signed)
Pt left message stating that she has continued to have bleeding even after the Megace.

## 2020-04-23 NOTE — Telephone Encounter (Signed)
Pt C/o continued fatigue having taken the Megace since OV please advise.  Would you like f/u appt?

## 2020-04-24 NOTE — Telephone Encounter (Signed)
Called patient stating I am returning her phone call. Patient states she has been taking the megace twice a day since October and it isn't working anymore. Patient states she has been bleeding very heavily for past 3 weeks. Told patient I would let Dr Macon Large know and we would reach back out to her with recommendations. Patient verbalized understanding.

## 2020-04-25 NOTE — Telephone Encounter (Signed)
Called patient and she reports she has been taking 40 mg BID for 3 weeks. Advised her she can increase to 2 tablets BID for 3-4 days, if not slowing, increase to 3 tablets BID and if bleeding not improved call back and make an appointment. Patient voiced understanding.

## 2020-04-25 NOTE — Telephone Encounter (Signed)
If she is taking 40 mg, she can increase to 80 mg bid. If 80 mg, she can increase to 120 mg bid to see if it helps.  If it does not, needs appointment to discuss further management.  Jaynie Collins, MD

## 2020-05-15 ENCOUNTER — Other Ambulatory Visit: Payer: Self-pay

## 2020-05-15 ENCOUNTER — Ambulatory Visit (INDEPENDENT_AMBULATORY_CARE_PROVIDER_SITE_OTHER): Payer: 59 | Admitting: Registered Nurse

## 2020-05-15 ENCOUNTER — Encounter: Payer: Self-pay | Admitting: Registered Nurse

## 2020-05-15 VITALS — BP 160/108 | HR 98 | Temp 97.7°F | Ht 63.0 in | Wt 206.4 lb

## 2020-05-15 DIAGNOSIS — Z1329 Encounter for screening for other suspected endocrine disorder: Secondary | ICD-10-CM

## 2020-05-15 DIAGNOSIS — N939 Abnormal uterine and vaginal bleeding, unspecified: Secondary | ICD-10-CM

## 2020-05-15 DIAGNOSIS — Z13228 Encounter for screening for other metabolic disorders: Secondary | ICD-10-CM

## 2020-05-15 DIAGNOSIS — Z130 Encounter for screening for diseases of the blood and blood-forming organs and certain disorders involving the immune mechanism: Secondary | ICD-10-CM

## 2020-05-15 NOTE — Patient Instructions (Signed)
° ° ° °  If you have lab work done today you will be contacted with your lab results within the next 2 weeks.  If you have not heard from us then please contact us. The fastest way to get your results is to register for My Chart. ° ° °IF you received an x-ray today, you will receive an invoice from [facility]. Please contact [facility] at [phone] with questions or concerns regarding your invoice.  ° °IF you received labwork today, you will receive an invoice from [facility]. Please contact [facility] at [phone] with questions or concerns regarding your invoice.  ° °Our billing staff will not be able to assist you with questions regarding bills from these companies. ° °You will be contacted with the lab results as soon as they are available. The fastest way to get your results is to activate your My Chart account. Instructions are located on the last page of this paperwork. If you have not heard from us regarding the results in 2 weeks, please contact this office. °  ° ° ° °

## 2020-05-16 LAB — CBC
Hematocrit: 42.3 % (ref 34.0–46.6)
Hemoglobin: 14.1 g/dL (ref 11.1–15.9)
MCH: 26.5 pg — ABNORMAL LOW (ref 26.6–33.0)
MCHC: 33.3 g/dL (ref 31.5–35.7)
MCV: 80 fL (ref 79–97)
Platelets: 306 10*3/uL (ref 150–450)
RBC: 5.32 x10E6/uL — ABNORMAL HIGH (ref 3.77–5.28)
RDW: 18 % — ABNORMAL HIGH (ref 11.7–15.4)
WBC: 6.8 10*3/uL (ref 3.4–10.8)

## 2020-05-16 LAB — BASIC METABOLIC PANEL
BUN/Creatinine Ratio: 22 (ref 9–23)
BUN: 13 mg/dL (ref 6–20)
CO2: 22 mmol/L (ref 20–29)
Calcium: 9.7 mg/dL (ref 8.7–10.2)
Chloride: 102 mmol/L (ref 96–106)
Creatinine, Ser: 0.58 mg/dL (ref 0.57–1.00)
GFR calc Af Amer: 143 mL/min/{1.73_m2} (ref >59)
GFR calc non Af Amer: 124 mL/min/{1.73_m2} (ref >59)
Glucose: 90 mg/dL (ref 65–99)
Potassium: 3.1 mmol/L — ABNORMAL LOW (ref 3.5–5.2)
Sodium: 140 mmol/L (ref 134–144)

## 2020-05-16 LAB — HEMOGLOBIN A1C
Est. average glucose Bld gHb Est-mCnc: 114 mg/dL
Hgb A1c MFr Bld: 5.6 % (ref 4.8–5.6)

## 2020-05-16 LAB — IRON,TIBC AND FERRITIN PANEL
Ferritin: 19 ng/mL (ref 15–150)
Iron Saturation: 8 % — CL (ref 15–55)
Iron: 39 ug/dL (ref 27–159)
Total Iron Binding Capacity: 497 ug/dL — ABNORMAL HIGH (ref 250–450)
UIBC: 458 ug/dL — ABNORMAL HIGH (ref 131–425)

## 2020-05-29 ENCOUNTER — Other Ambulatory Visit: Payer: Self-pay | Admitting: Registered Nurse

## 2020-05-29 ENCOUNTER — Ambulatory Visit: Payer: 59

## 2020-05-29 ENCOUNTER — Other Ambulatory Visit: Payer: Self-pay

## 2020-05-29 ENCOUNTER — Telehealth: Payer: Self-pay | Admitting: Registered Nurse

## 2020-05-29 ENCOUNTER — Telehealth (INDEPENDENT_AMBULATORY_CARE_PROVIDER_SITE_OTHER): Payer: 59 | Admitting: Registered Nurse

## 2020-05-29 DIAGNOSIS — R059 Cough, unspecified: Secondary | ICD-10-CM | POA: Diagnosis not present

## 2020-05-29 DIAGNOSIS — Z20822 Contact with and (suspected) exposure to covid-19: Secondary | ICD-10-CM | POA: Diagnosis not present

## 2020-05-29 DIAGNOSIS — R52 Pain, unspecified: Secondary | ICD-10-CM

## 2020-05-29 MED ORDER — HYDROCODONE-HOMATROPINE 5-1.5 MG/5ML PO SYRP: 5.0000 mL | ORAL_SOLUTION | Freq: Every evening | ORAL | 0 refills | Status: DC | PRN

## 2020-05-29 MED ORDER — BENZONATATE 100 MG PO CAPS: 100.0000 mg | ORAL_CAPSULE | Freq: Two times a day (BID) | ORAL | 0 refills | Status: DC | PRN

## 2020-05-29 MED ORDER — HYDROCODONE-HOMATROPINE 5-1.5 MG/5ML PO SYRP: 5.0000 mL | ORAL_SOLUTION | Freq: Three times a day (TID) | ORAL | 0 refills | Status: DC | PRN

## 2020-05-29 NOTE — Telephone Encounter (Signed)
Pharmacy called stating that the hydrocodone cough syrup that was sent in for pt can not be filled as a 24 day prescription since pt has never used it before. They state that they can only give her a 7 day supply at a time. Pt was okay with this, but wanted to make PCP aware in case she needed more in the future.  Please advise.'       [facility] [address] [place] - [address]  [address] [place] [address]  Phone: [phone] Fax: [phone]  Hours: Not open 24 hours

## 2020-05-29 NOTE — Telephone Encounter (Signed)
Please advise 

## 2020-05-29 NOTE — Patient Instructions (Signed)
° ° ° °  If you have lab work done today you will be contacted with your lab results within the next 2 weeks.  If you have not heard from us then please contact us. The fastest way to get your results is to register for My Chart. ° ° °IF you received an x-ray today, you will receive an invoice from [facility]. Please contact [facility] at [phone] with questions or concerns regarding your invoice.  ° °IF you received labwork today, you will receive an invoice from [facility]. Please contact [facility] at [phone] with questions or concerns regarding your invoice.  ° °Our billing staff will not be able to assist you with questions regarding bills from these companies. ° °You will be contacted with the lab results as soon as they are available. The fastest way to get your results is to activate your My Chart account. Instructions are located on the last page of this paperwork. If you have not heard from us regarding the results in 2 weeks, please contact this office. °  ° ° ° °

## 2020-05-29 NOTE — Progress Notes (Signed)
Telemedicine Encounter- SOAP NOTE Established Patient  This telephone encounter was conducted with the patient's (or proxy's) verbal consent via audio telecommunications: yes  Patient was instructed to have this encounter in a suitably private space; and to only have persons present to whom they give permission to participate. In addition, patient identity was confirmed by use of name plus two identifiers (DOB and address).  I discussed the limitations, risks, security and privacy concerns of performing an evaluation and management service by telephone and the availability of in person appointments. I also discussed with the patient that there may be a patient responsible charge related to this service. The patient expressed understanding and agreed to proceed.  I spent a total of 12 minutes talking with the patient or their proxy.  Patient at home Provider in office  Chief Complaint  Patient presents with  . Cough    Patient states she needs a covid test because she is experiencing headache, cough, body aches, fatigue,and loss of taste since last Tuesday 05/21/2020. Per patient states 12 people at her job tested positive.    Subjective   [name] is a 30 y.o. established patient. Telephone visit today for close exposure to covid  HPI 10+ coworkers tested positive She has had 8 days of cough, aches, fatigue, loss of taste Has not yet been tested Fatigue has been extreme enough that she has called out of work twice No further symptoms Symptoms stable, not worsening Has not tried any medications for this Sleep is poor   Patient Active Problem List   Diagnosis Date Noted  . Iron deficiency anemia due to chronic menstrual blood loss 02/15/2020    Past Medical History:  Diagnosis Date  . Anemia    Phreesia 02/19/2020  . Hypertension    Phreesia 02/19/2020    Current Outpatient Medications  Medication Sig Dispense Refill  . benzonatate (TESSALON) 100 MG capsule Take  1 capsule (100 mg total) by mouth 2 (two) times daily as needed for cough. 20 capsule 0  . ferrous sulfate (FERROUSUL) 325 (65 FE) MG tablet Take 1 tablet (325 mg total) by mouth 2 (two) times daily. 60 tablet 1  . FLUoxetine (PROZAC) 20 MG capsule Take 1 capsule (20 mg total) by mouth daily. 90 capsule 0  . hydrochlorothiazide (HYDRODIURIL) 25 MG tablet Take 1 tablet (25 mg total) by mouth daily. 30 tablet 3  . HYDROcodone-homatropine (HYCODAN) 5-1.5 MG/5ML syrup Take 5 mLs by mouth at bedtime as needed for cough. 120 mL 0  . megestrol (MEGACE) 40 MG tablet Take 1 tablet (40 mg total) by mouth daily. Can increase to one tablet twice a day in the event of heavy bleeding 60 tablet 5   No current facility-administered medications for this visit.    No Known Allergies  Social History   Socioeconomic History  . Marital status: Single    Spouse name: Not on file  . Number of children: 1  . Years of education: Not on file  . Highest education level: Not on file  Occupational History  . Not on file  Tobacco Use  . Smoking status: Never Smoker  . Smokeless tobacco: Never Used  Vaping Use  . Vaping Use: Never used  Substance and Sexual Activity  . Alcohol use: Yes  . Drug use: No  . Sexual activity: Yes    Birth control/protection: None  Other Topics Concern  . Not on file  Social History Narrative   Marital status: engaged; moved to  [place] from [place] in 2000.        Children;  One child (9yo)      Lives:  With husband, son.      Employment:  [employer], studies sociology      Tobacco: none       Alcohol: wine on weekends      Drugs:  none   Social Determinants of Corporate investment banker Strain: Not on file  Food Insecurity: No Food Insecurity  . Worried About Programme researcher, broadcasting/film/video in the Last Year: Never true  . Ran Out of Food in the Last Year: Never true  Transportation Needs: No Transportation Needs  . Lack of Transportation (Medical): No  . Lack of  Transportation (Non-Medical): No  Physical Activity: Not on file  Stress: Not on file  Social Connections: Not on file  Intimate Partner Violence: Not on file    ROS Per hpi   Objective   Vitals as reported by the patient: There were no vitals filed for this visit.  [name] was seen today for cough.  Diagnoses and all orders for this visit:  Cough -     COVID-19, Flu A+B and RSV -     HYDROcodone-homatropine (HYCODAN) 5-1.5 MG/5ML syrup; Take 5 mLs by mouth at bedtime as needed for cough. -     benzonatate (TESSALON) 100 MG capsule; Take 1 capsule (100 mg total) by mouth 2 (two) times daily as needed for cough.  Body aches -     COVID-19, Flu A+B and RSV  Close exposure to COVID-19 virus   PLAN  Tessalon and hycodan for cough and sleep  Pt will present today for drive up testing for covid, flu, and rsv  Stay home from work today, tomorrow, and Friday.  Return when symptoms have improved  Patient encouraged to call clinic with any questions, comments, or concerns.   I discussed the assessment and treatment plan with the patient. The patient was provided an opportunity to ask questions and all were answered. The patient agreed with the plan and demonstrated an understanding of the instructions.   The patient was advised to call back or seek an in-person evaluation if the symptoms worsen or if the condition fails to improve as anticipated.  I provided 12 minutes of non-face-to-face time during this encounter.  Janeece Agee, NP  Primary Care at [facility]

## 2020-05-31 LAB — COVID-19, FLU A+B AND RSV
Influenza A, NAA: NOT DETECTED
Influenza B, NAA: NOT DETECTED
RSV, NAA: NOT DETECTED
SARS-CoV-2, NAA: NOT DETECTED

## 2020-06-03 NOTE — Progress Notes (Signed)
Please call patient to let her know all negative.  Thank you,  Rich

## 2020-06-18 ENCOUNTER — Encounter: Payer: Self-pay | Admitting: Registered Nurse

## 2020-07-17 ENCOUNTER — Ambulatory Visit: Payer: 59 | Admitting: Registered Nurse

## 2020-07-24 ENCOUNTER — Ambulatory Visit (INDEPENDENT_AMBULATORY_CARE_PROVIDER_SITE_OTHER): Payer: 59 | Admitting: Registered Nurse

## 2020-07-24 ENCOUNTER — Other Ambulatory Visit: Payer: Self-pay

## 2020-07-24 VITALS — BP 164/131 | HR 83 | Temp 97.6°F | Resp 16 | Ht 63.0 in | Wt 213.0 lb

## 2020-07-24 DIAGNOSIS — R635 Abnormal weight gain: Secondary | ICD-10-CM

## 2020-07-24 DIAGNOSIS — I10 Essential (primary) hypertension: Secondary | ICD-10-CM | POA: Diagnosis not present

## 2020-07-24 NOTE — Patient Instructions (Signed)
° ° ° °  If you have lab work done today you will be contacted with your lab results within the next 2 weeks.  If you have not heard from us then please contact us. The fastest way to get your results is to register for My Chart. ° ° °IF you received an x-ray today, you will receive an invoice from [facility]. Please contact [facility] at [phone] with questions or concerns regarding your invoice.  ° °IF you received labwork today, you will receive an invoice from [facility]. Please contact [facility] at [phone] with questions or concerns regarding your invoice.  ° °Our billing staff will not be able to assist you with questions regarding bills from these companies. ° °You will be contacted with the lab results as soon as they are available. The fastest way to get your results is to activate your My Chart account. Instructions are located on the last page of this paperwork. If you have not heard from us regarding the results in 2 weeks, please contact this office. °  ° ° ° °

## 2020-07-30 ENCOUNTER — Other Ambulatory Visit: Payer: Self-pay

## 2020-07-30 ENCOUNTER — Telehealth: Payer: Self-pay | Admitting: Registered Nurse

## 2020-07-30 DIAGNOSIS — I10 Essential (primary) hypertension: Secondary | ICD-10-CM

## 2020-07-30 MED ORDER — HYDROCHLOROTHIAZIDE 25 MG PO TABS: 25.0000 mg | ORAL_TABLET | Freq: Every day | ORAL | 3 refills | Status: DC

## 2020-07-30 NOTE — Telephone Encounter (Signed)
Pt in stating at her last appt with Richard on 07/24/20  that a BP medication was to be sent in. Pt uses [facility] on [address] and can be reached at the home #.   Please advise

## 2020-07-30 NOTE — Telephone Encounter (Signed)
Sent patient medication to the pharmacy.

## 2020-08-02 ENCOUNTER — Other Ambulatory Visit: Payer: Self-pay

## 2020-08-02 ENCOUNTER — Emergency Department (HOSPITAL_COMMUNITY)
Admission: EM | Admit: 2020-08-02 | Discharge: 2020-08-02 | Disposition: A | Discharge status: Home / Self Care | Payer: 59 | Attending: Emergency Medicine | Admitting: Emergency Medicine

## 2020-08-02 ENCOUNTER — Encounter (HOSPITAL_COMMUNITY): Payer: Self-pay

## 2020-08-02 DIAGNOSIS — J36 Peritonsillar abscess: Secondary | ICD-10-CM | POA: Diagnosis not present

## 2020-08-02 DIAGNOSIS — I10 Essential (primary) hypertension: Secondary | ICD-10-CM | POA: Diagnosis not present

## 2020-08-02 DIAGNOSIS — Z79899 Other long term (current) drug therapy: Secondary | ICD-10-CM | POA: Insufficient documentation

## 2020-08-02 DIAGNOSIS — R1310 Dysphagia, unspecified: Secondary | ICD-10-CM | POA: Diagnosis present

## 2020-08-02 LAB — CBC WITH DIFFERENTIAL/PLATELET
Abs Immature Granulocytes: 0.1 10*3/uL — ABNORMAL HIGH (ref 0.00–0.07)
Basophils Absolute: 0.1 10*3/uL (ref 0.0–0.1)
Basophils Relative: 0 %
Eosinophils Absolute: 0 10*3/uL (ref 0.0–0.5)
Eosinophils Relative: 0 %
HCT: 39.8 % (ref 36.0–46.0)
Hemoglobin: 12.7 g/dL (ref 12.0–15.0)
Immature Granulocytes: 1 %
Lymphocytes Relative: 15 %
Lymphs Abs: 2.3 10*3/uL (ref 0.7–4.0)
MCH: 25.1 pg — ABNORMAL LOW (ref 26.0–34.0)
MCHC: 31.9 g/dL (ref 30.0–36.0)
MCV: 78.7 fL — ABNORMAL LOW (ref 80.0–100.0)
Monocytes Absolute: 1.6 10*3/uL — ABNORMAL HIGH (ref 0.1–1.0)
Monocytes Relative: 10 %
Neutro Abs: 11.8 10*3/uL — ABNORMAL HIGH (ref 1.7–7.7)
Neutrophils Relative %: 74 %
Platelets: 276 10*3/uL (ref 150–400)
RBC: 5.06 MIL/uL (ref 3.87–5.11)
RDW: 15.3 % (ref 11.5–15.5)
WBC: 15.8 10*3/uL — ABNORMAL HIGH (ref 4.0–10.5)
nRBC: 0 % (ref 0.0–0.2)

## 2020-08-02 LAB — BASIC METABOLIC PANEL
Anion gap: 14 (ref 5–15)
BUN: 12 mg/dL (ref 6–20)
CO2: 21 mmol/L — ABNORMAL LOW (ref 22–32)
Calcium: 9.5 mg/dL (ref 8.9–10.3)
Chloride: 102 mmol/L (ref 98–111)
Creatinine, Ser: 0.71 mg/dL (ref 0.44–1.00)
GFR, Estimated: >60 mL/min (ref >60)
Glucose, Bld: 99 mg/dL (ref 70–99)
Potassium: 3.1 mmol/L — ABNORMAL LOW (ref 3.5–5.1)
Sodium: 137 mmol/L (ref 135–145)

## 2020-08-02 LAB — GROUP A STREP BY PCR: Group A Strep by PCR: NOT DETECTED

## 2020-08-02 MED ORDER — CLINDAMYCIN PHOSPHATE 300 MG/50ML IV SOLN
300.0000 mg | Freq: Once | INTRAVENOUS | Status: AC
  Administered 2020-08-02: 300 mg via INTRAVENOUS
  Filled 2020-08-02: qty 50

## 2020-08-02 MED ORDER — DEXAMETHASONE SODIUM PHOSPHATE 10 MG/ML IJ SOLN
10.0000 mg | Freq: Once | INTRAMUSCULAR | Status: AC
  Administered 2020-08-02: 10 mg via INTRAVENOUS
  Filled 2020-08-02: qty 1

## 2020-08-02 MED ORDER — SODIUM CHLORIDE 0.9 % IV BOLUS
1000.0000 mL | Freq: Once | INTRAVENOUS | Status: AC
  Administered 2020-08-02: 1000 mL via INTRAVENOUS

## 2020-08-02 MED ORDER — LIDOCAINE-EPINEPHRINE 1 %-1:100000 IJ SOLN
20.0000 mL | Freq: Once | INTRAMUSCULAR | Status: AC
  Administered 2020-08-02: 20 mL
  Filled 2020-08-02: qty 20

## 2020-08-02 MED ORDER — CLINDAMYCIN PHOSPHATE 600 MG/50ML IV SOLN
600.0000 mg | Freq: Once | INTRAVENOUS | Status: AC
  Administered 2020-08-02: 600 mg via INTRAVENOUS
  Filled 2020-08-02: qty 50

## 2020-08-02 MED ORDER — KETOROLAC TROMETHAMINE 15 MG/ML IJ SOLN
15.0000 mg | Freq: Once | INTRAMUSCULAR | Status: AC
  Administered 2020-08-02: 15 mg via INTRAVENOUS
  Filled 2020-08-02: qty 1

## 2020-08-02 MED ORDER — CLINDAMYCIN HCL 150 MG PO CAPS: 300.0000 mg | ORAL_CAPSULE | Freq: Three times a day (TID) | ORAL | 0 refills | Status: AC

## 2020-08-02 NOTE — ED Notes (Signed)
ED Provider at bedside. 

## 2020-08-02 NOTE — ED Provider Notes (Signed)
[facility]-EMERGENCY DEPT Provider Note   CSN: [id number] Arrival date & time: 08/02/20  1750     History Chief Complaint  Patient presents with  . Sore Throat    [name] is a 30 y.o. female.  HPI Patient presented today with 1 week of gradually worsening severe right-sided sore throat.  She states that it is progressed to the point where she is now having difficulty swallowing.  She states that her voice is changed and she now sounds muffled.  She denies any nausea vomiting lightheadedness or dizziness.  She states that she has had fevers over the past 2 days.  She denies any headaches visits that she has had some rhinorrhea.  Denies any sick contacts.  States that she has not any had any Covid exposure that she knows of.  She states she has never had a peritonsillar abscess in the past.    Past Medical History:  Diagnosis Date  . Anemia    Phreesia 02/19/2020  . Hypertension    Phreesia 02/19/2020    Patient Active Problem List   Diagnosis Date Noted  . Iron deficiency anemia due to chronic menstrual blood loss 02/15/2020    Past Surgical History:  Procedure Laterality Date  . CESAREAN SECTION  2012     OB History    Gravida  1   Para  1   Term  1   Preterm  0   AB  0   Living  1     SAB  0   IAB  0   Ectopic  0   Multiple  0   Live Births  1           Family History  Problem Relation Age of Onset  . Hypertension Mother     Social History   Tobacco Use  . Smoking status: Never Smoker  . Smokeless tobacco: Never Used  Vaping Use  . Vaping Use: Never used  Substance Use Topics  . Alcohol use: Yes  . Drug use: No    Home Medications Prior to Admission medications   Medication Sig Start Date End Date Taking? Authorizing Provider  clindamycin (CLEOCIN) 150 MG capsule Take 2 capsules (300 mg total) by mouth 3 (three) times daily for 10 days. 08/02/20 08/12/20 Yes Onita Pfluger S, PA  ferrous sulfate  (FERROUSUL) 325 (65 FE) MG tablet Take 1 tablet (325 mg total) by mouth 2 (two) times daily. 02/15/20  Yes Anyanwu, Jethro Bastos, MD  hydrochlorothiazide (HYDRODIURIL) 25 MG tablet Take 1 tablet (25 mg total) by mouth daily. 07/30/20  Yes Janeece Agee, NP  ibuprofen (ADVIL) 200 MG tablet Take 400 mg by mouth every 6 (six) hours as needed for mild pain.   Yes [provider]  amoxicillin (AMOXIL) 875 MG tablet Take 875 mg by mouth 2 (two) times daily. 08/01/20   [provider]  FLUoxetine (PROZAC) 20 MG capsule Take 1 capsule (20 mg total) by mouth daily. Patient not taking: No sig reported 02/20/20   Janeece Agee, NP  megestrol (MEGACE) 40 MG tablet Take 1 tablet (40 mg total) by mouth daily. Can increase to one tablet twice a day in the event of heavy bleeding Patient not taking: No sig reported 02/14/20   Anyanwu, Jethro Bastos, MD    Allergies    Patient has no known allergies.  Review of Systems   Review of Systems  Constitutional: Positive for chills and fever.  HENT: Positive for rhinorrhea and  sore throat. Negative for congestion.   Eyes: Negative for pain.  Respiratory: Negative for cough and shortness of breath.   Cardiovascular: Negative for chest pain and leg swelling.  Gastrointestinal: Negative for abdominal pain, diarrhea, nausea and vomiting.  Genitourinary: Negative for dysuria.  Musculoskeletal: Negative for myalgias.  Skin: Negative for rash.  Neurological: Negative for dizziness and headaches.    Physical Exam Updated Vital Signs BP (!) 162/104 (BP Location: Right Arm)   Pulse (!) 109   Temp 98.2 F (36.8 C) (Oral)   Resp 18   SpO2 100%   Physical Exam Vitals and nursing note reviewed.  Constitutional:      Appearance: Normal appearance. She is obese. She is not ill-appearing.     Comments: Patient is very uncomfortable appearing has muffled voice with palpitating voice.  HENT:     Head: Normocephalic and atraumatic.     Mouth/Throat:      Comments: Oropharynx is moist.  Right tonsillar hypertrophy and uvular deviation to the left with what appears to be swelling of the right tonsillar pillar. Eyes:     General: No scleral icterus.       Right eye: No discharge.        Left eye: No discharge.     Conjunctiva/sclera: Conjunctivae normal.  Pulmonary:     Effort: Pulmonary effort is normal.     Breath sounds: No stridor.  Neurological:     Mental Status: She is alert and oriented to person, place, and time. Mental status is at baseline.     ED Results / Procedures / Treatments   Labs (all labs ordered are listed, but only abnormal results are displayed) Labs Reviewed  BASIC METABOLIC PANEL - Abnormal; Notable for the following components:      Result Value   Potassium 3.1 (*)    CO2 21 (*)    All other components within normal limits  CBC WITH DIFFERENTIAL/PLATELET - Abnormal; Notable for the following components:   WBC 15.8 (*)    MCV 78.7 (*)    MCH 25.1 (*)    Neutro Abs 11.8 (*)    Monocytes Absolute 1.6 (*)    Abs Immature Granulocytes 0.10 (*)    All other components within normal limits  GROUP A STREP BY PCR    EKG None  Radiology No results found.  Procedures Ultrasound ED Soft Tissue  Date/Time: 08/03/2020 12:27 AM Performed by: Gailen Shelter, PA Authorized by: Gailen Shelter, PA   Procedure details:    Indications: localization of abscess     Transverse view:  Visualized   Longitudinal view:  Not visualized   Images: archived     Limitations:  Patient compliance and positioning Location:    Location comment:  Throat   Side:  Right Findings:     abscess present     Medications Ordered in ED Medications  sodium chloride 0.9 % bolus 1,000 mL (0 mLs Intravenous Stopped 08/02/20 2037)  clindamycin (CLEOCIN) IVPB 600 mg (0 mg Intravenous Stopped 08/02/20 2037)  dexamethasone (DECADRON) injection 10 mg (10 mg Intravenous Given 08/02/20 1946)  dexamethasone (DECADRON) injection 10 mg (10 mg  Intravenous Given 08/02/20 2117)  clindamycin (CLEOCIN) IVPB 300 mg (0 mg Intravenous Stopped 08/02/20 2206)  sodium chloride 0.9 % bolus 1,000 mL (0 mLs Intravenous Stopped 08/02/20 2231)  lidocaine-EPINEPHrine (XYLOCAINE W/EPI) 1 %-1:100000 (with pres) injection 20 mL (20 mLs Infiltration Given by Other 08/02/20 2120)  ketorolac (TORADOL) 15 MG/ML injection 15 mg (15  mg Intravenous Given 08/02/20 2251)    ED Course  I have reviewed the triage vital signs and the nursing notes.  Pertinent labs & imaging results that were available during my care of the patient were reviewed by me and considered in my medical decision making (see chart for details).  Patient is a 30 year old female with a peritonsillar abscess It is right-sided.  She has a potato voice and fevers and sore throat.  This is been a progressively worsening symptoms over the past week.  Covid swab obtained and is negative.  Leukocytosis present likely from her peritonsillar abscess.  No anemia.  BMP with mild hypokalemia no other significant abnormalities.  Clinical Course as of 08/03/20 0028  Fri Aug 02, 2020  2003 Discussed with ENT who will call back in 2 hours--recommended total 20 of decadron and 900 clindamycin [WF]    Clinical Course User Index [WF] Gailen Shelter, [place]   MDM Rules/Calculators/A&P                          Patient had incision and drainage done by ENT MD Suszanne Conners   Will follow up with PCP and given information for [facility] to follow-up with if needed.  Discharged with clindamycin 300 3 times daily for 10 days.  Tylenol and ibuprofen recommendations given as well.  Final Clinical Impression(s) / ED Diagnoses Final diagnoses:  Peritonsillar abscess    Rx / DC Orders ED Discharge Orders         Ordered    clindamycin (CLEOCIN) 150 MG capsule  3 times daily        08/02/20 2158           Solon Augusta [place], [place] 08/03/20 0029    Mancel Bale, MD 08/05/20 1008

## 2020-08-02 NOTE — Consult Note (Signed)
Reason for Consult: Right peritonsillar abscess Referring Physician: Gailen Shelter, PA  HPI:  [name] is an 30 y.o. female who presents to the [facility] ER today c/o right sided sore throat for 4 days. She reports increasing dysphagia and odynophagia. Her evaluation in the ER shows likely right peritonsillar abscess.  Past Medical History:  Diagnosis Date  . Anemia    Phreesia 02/19/2020  . Hypertension    Phreesia 02/19/2020    Past Surgical History:  Procedure Laterality Date  . CESAREAN SECTION  2012    Family History  Problem Relation Age of Onset  . Hypertension Mother     Social History:  reports that she has never smoked. She has never used smokeless tobacco. She reports current alcohol use. She reports that she does not use drugs.  Allergies: No Known Allergies  Prior to Admission medications   Medication Sig Start Date End Date Taking? Authorizing Provider  ferrous sulfate (FERROUSUL) 325 (65 FE) MG tablet Take 1 tablet (325 mg total) by mouth 2 (two) times daily. 02/15/20   Anyanwu, Jethro Bastos, MD  FLUoxetine (PROZAC) 20 MG capsule Take 1 capsule (20 mg total) by mouth daily. 02/20/20   Janeece Agee, NP  hydrochlorothiazide (HYDRODIURIL) 25 MG tablet Take 1 tablet (25 mg total) by mouth daily. 07/30/20   Janeece Agee, NP  megestrol (MEGACE) 40 MG tablet Take 1 tablet (40 mg total) by mouth daily. Can increase to one tablet twice a day in the event of heavy bleeding Patient not taking: Reported on 07/24/2020 02/14/20   Tereso Newcomer, MD     Results for orders placed or performed during the hospital encounter of 08/02/20 (from the past 48 hour(s))  Group A Strep by PCR     Status: None   Collection Time: 08/02/20  7:15 PM   Specimen: Throat; Sterile Swab  Result Value Ref Range   Group A Strep by PCR NOT DETECTED NOT DETECTED    Comment: Performed at [facility], [address], [place], [place] [address]  Basic metabolic panel      Status: Abnormal   Collection Time: 08/02/20  7:45 PM  Result Value Ref Range   Sodium 137 135 - 145 mmol/L   Potassium 3.1 (L) 3.5 - 5.1 mmol/L   Chloride 102 98 - 111 mmol/L   CO2 21 (L) 22 - 32 mmol/L   Glucose, Bld 99 70 - 99 mg/dL    Comment: Glucose reference range applies only to samples taken after fasting for at least 8 hours.   BUN 12 6 - 20 mg/dL   Creatinine, Ser 5.46 0.44 - 1.00 mg/dL   Calcium 9.5 8.9 - 50.3 mg/dL   GFR, Estimated >54 >65 mL/min    Comment: (NOTE) Calculated using the CKD-EPI Creatinine Equation (2021)    Anion gap 14 5 - 15    Comment: Performed at [facility], [address] [place], [place] [address]  CBC with Differential/Platelet     Status: Abnormal   Collection Time: 08/02/20  7:45 PM  Result Value Ref Range   WBC 15.8 (H) 4.0 - 10.5 K/uL   RBC 5.06 3.87 - 5.11 MIL/uL   Hemoglobin 12.7 12.0 - 15.0 g/dL   HCT 51.7 00.1 - 74.9 %   MCV 78.7 (L) 80.0 - 100.0 fL   MCH 25.1 (L) 26.0 - 34.0 pg   MCHC 31.9 30.0 - 36.0 g/dL   RDW 44.9 67.5 - 91.6 %  Platelets 276 150 - 400 K/uL   nRBC 0.0 0.0 - 0.2 %   Neutrophils Relative % 74 %   Neutro Abs 11.8 (H) 1.7 - 7.7 K/uL   Lymphocytes Relative 15 %   Lymphs Abs 2.3 0.7 - 4.0 K/uL   Monocytes Relative 10 %   Monocytes Absolute 1.6 (H) 0.1 - 1.0 K/uL   Eosinophils Relative 0 %   Eosinophils Absolute 0.0 0.0 - 0.5 K/uL   Basophils Relative 0 %   Basophils Absolute 0.1 0.0 - 0.1 K/uL   Immature Granulocytes 1 %   Abs Immature Granulocytes 0.10 (H) 0.00 - 0.07 K/uL    Comment: Performed at [facility], [address], [place], [place] [address]    No results found.  Review of Systems  Constitutional: Positive for fever.  The rest of ROS are negative.  Blood pressure (!) 166/111, pulse (!) 113, temperature 100.3 F (37.9 C), temperature source Oral, resp. rate 18, SpO2 100 %. General appearance: alert, cooperative and mild distress Head: Normocephalic,  without obvious abnormality, atraumatic Eyes: Pupils are equal, round, reactive to light. Extraocular motion is intact.  Ears: Examination of the ears shows normal auricles and external auditory canals bilaterally.  Nose: Nasal examination shows normal mucosa, septum, turbinates.  Face: Facial examination shows no asymmetry. Palpation of the face elicit no significant tenderness.  Mouth: Oral cavity examination shows right peritonsillar edema and erythema. Neck: The trachea is midline. The thyroid is not significantly enlarged.  Neuro: Cranial nerves 2-12 are all grossly in tact.  Procedure: Incision and drainage of the right peritonsillar abscess.   Anesthesia: local anesthesia with 1% lidocaine with epinephrine.   Description: The patient is placed upright on the hospital bed.  After adequate local anesthesia is achieved, an 18-G spinal needle is used to make multiple passes over the right superior tonsillar pole.  Approximately 2 cc of purulent fluid was evacuated.  The abscess cavity was incised opened with the spinal needle tip.  The patient tolerated the procedure well.    Assessment/Plan: Right peritonsillar abscess. - I&D performed under local anesthesia. - Clindamycin 300mg  po QID for 10 days. - Pt may follow up with me as needed.  Gentri Guardado W Rayelynn Loyal 08/02/2020, 9:47 PM

## 2020-08-02 NOTE — ED Triage Notes (Signed)
Pt reports sore throat x4 days. Pts throat is visibly red and swollen with pus.

## 2020-08-02 NOTE — Discharge Instructions (Addendum)
Please take antibiotics as prescribed. Please follow-up with your primary care doctor I have also given you the information for the [facility] ENT group.  You may follow-up with them or your primary care doctor or whoever is able to see you first.   Please use Tylenol or ibuprofen for pain.  You may use 600 mg ibuprofen every 6 hours or 1000 mg of Tylenol every 6 hours.  You may choose to alternate between the 2.  This would be most effective.  Not to exceed 4 g of Tylenol within 24 hours.  Not to exceed 3200 mg ibuprofen 24 hours.

## 2020-09-05 ENCOUNTER — Other Ambulatory Visit: Payer: Self-pay

## 2020-09-05 ENCOUNTER — Encounter: Payer: Self-pay | Admitting: Registered Nurse

## 2020-09-05 ENCOUNTER — Ambulatory Visit (INDEPENDENT_AMBULATORY_CARE_PROVIDER_SITE_OTHER): Payer: 59 | Admitting: Registered Nurse

## 2020-09-05 VITALS — BP 139/83 | HR 79 | Temp 98.1°F | Resp 17 | Ht 63.0 in | Wt 209.2 lb

## 2020-09-05 DIAGNOSIS — E669 Obesity, unspecified: Secondary | ICD-10-CM

## 2020-09-05 DIAGNOSIS — F418 Other specified anxiety disorders: Secondary | ICD-10-CM | POA: Diagnosis not present

## 2020-09-05 MED ORDER — HYDROXYZINE HCL 10 MG PO TABS: 10.0000 mg | ORAL_TABLET | Freq: Three times a day (TID) | ORAL | 0 refills | Status: DC | PRN

## 2020-09-05 MED ORDER — FLUOXETINE HCL 40 MG PO CAPS: 40.0000 mg | ORAL_CAPSULE | Freq: Every day | ORAL | 1 refills | Status: DC

## 2020-09-05 MED ORDER — PHENTERMINE HCL 15 MG PO CAPS
15.0000 mg | ORAL_CAPSULE | ORAL | 0 refills | Status: DC
Start: 2020-09-05 — End: 2020-10-04

## 2020-09-05 NOTE — Progress Notes (Signed)
Established Patient Office Visit  Subjective:  Patient ID: [name], female    DOB: [birth year]  Age: 30 y.o. MRN: [id number]  CC:  Chief Complaint  Patient presents with  . Weight Loss    Patient states she would like to discuss weight loss.    HPI [name] presents for weight management and fertility  Weight Lost 4 lbs since last visit with diet and exercise Has gotten blood pressure under control with med compliance and lifestyle Interested in starting phentermine as previously discussed Aware of r/b/se  Fertility [place] in 2021 showed possible adenomyosis as cause for her AUB.  Took megace, stopped d/t weight gain. Fortunately AUB is stopped Interested in how this may affect her fertility as her and her partner are thinking about trying again.  Anxiety Worsening. Situational Has been compliant with fluoxetine 20mg  PO qd Notes mind racing at night, trouble falling asleep.   Otherwise feeling well. No concerns.   Past Medical History:  Diagnosis Date  . Anemia    Phreesia 02/19/2020  . Hypertension    Phreesia 02/19/2020    Past Surgical History:  Procedure Laterality Date  . CESAREAN SECTION  2012    Family History  Problem Relation Age of Onset  . Hypertension Mother     Social History   Socioeconomic History  . Marital status: Single    Spouse name: Not on file  . Number of children: 1  . Years of education: Not on file  . Highest education level: Not on file  Occupational History  . Not on file  Tobacco Use  . Smoking status: Never Smoker  . Smokeless tobacco: Never Used  Vaping Use  . Vaping Use: Never used  Substance and Sexual Activity  . Alcohol use: Yes  . Drug use: No  . Sexual activity: Yes    Birth control/protection: None  Other Topics Concern  . Not on file  Social History Narrative   Marital status: engaged; moved to [place] from [place] in 2000.        Children;  One child (9yo)      Lives:  With husband, son.       Employment:  2001, studies sociology      Tobacco: none       Alcohol: wine on weekends      Drugs:  none   Social Determinants of [employer] Strain: Not on file  Food Insecurity: No Food Insecurity  . Worried About Corporate investment banker in the Last Year: Never true  . Ran Out of Food in the Last Year: Never true  Transportation Needs: No Transportation Needs  . Lack of Transportation (Medical): No  . Lack of Transportation (Non-Medical): No  Physical Activity: Not on file  Stress: Not on file  Social Connections: Not on file  Intimate Partner Violence: Not on file    Outpatient Medications Prior to Visit  Medication Sig Dispense Refill  . ferrous sulfate (FERROUSUL) 325 (65 FE) MG tablet Take 1 tablet (325 mg total) by mouth 2 (two) times daily. 60 tablet 1  . hydrochlorothiazide (HYDRODIURIL) 25 MG tablet Take 1 tablet (25 mg total) by mouth daily. 30 tablet 3  . ibuprofen (ADVIL) 200 MG tablet Take 400 mg by mouth every 6 (six) hours as needed for mild pain.    Programme researcher, broadcasting/film/video amoxicillin (AMOXIL) 875 MG tablet Take 875 mg by mouth 2 (two) times daily. (Patient not taking: Reported on 09/05/2020)    .  FLUoxetine (PROZAC) 20 MG capsule Take 1 capsule (20 mg total) by mouth daily. (Patient not taking: No sig reported) 90 capsule 0  . megestrol (MEGACE) 40 MG tablet Take 1 tablet (40 mg total) by mouth daily. Can increase to one tablet twice a day in the event of heavy bleeding (Patient not taking: No sig reported) 60 tablet 5   No facility-administered medications prior to visit.    No Known Allergies  ROS Review of Systems  Constitutional: Negative.   HENT: Negative.   Eyes: Negative.   Respiratory: Negative.   Cardiovascular: Negative.   Gastrointestinal: Negative.   Genitourinary: Negative.   Musculoskeletal: Negative.   Skin: Negative.   Neurological: Negative.   Psychiatric/Behavioral: Negative.   All other systems reviewed and are negative.      Objective:    Physical Exam Vitals and nursing note reviewed.  Constitutional:      General: She is not in acute distress.    Appearance: Normal appearance. She is normal weight. She is not ill-appearing, toxic-appearing or diaphoretic.  Cardiovascular:     Rate and Rhythm: Normal rate and regular rhythm.     Heart sounds: Normal heart sounds. No murmur heard. No friction rub. No gallop.   Pulmonary:     Effort: Pulmonary effort is normal. No respiratory distress.     Breath sounds: Normal breath sounds. No stridor. No wheezing, rhonchi or rales.  Chest:     Chest wall: No tenderness.  Skin:    General: Skin is warm and dry.  Neurological:     General: No focal deficit present.     Mental Status: She is alert and oriented to person, place, and time. Mental status is at baseline.  Psychiatric:        Mood and Affect: Mood normal.        Behavior: Behavior normal.        Thought Content: Thought content normal.        Judgment: Judgment normal.     BP 139/83   Pulse 79   Temp 98.1 F (36.7 C) (Temporal)   Resp 17   Ht 5\' 3"  (1.6 m)   Wt 209 lb 3.2 oz (94.9 kg)   SpO2 99%   BMI 37.06 kg/m  Wt Readings from Last 3 Encounters:  09/05/20 209 lb 3.2 oz (94.9 kg)  07/24/20 213 lb (96.6 kg)  05/15/20 206 lb 6.4 oz (93.6 kg)     Health Maintenance Due  Topic Date Due  . COVID-19 Vaccine (1) Never done    There are no preventive care reminders to display for this patient.  Lab Results  Component Value Date   TSH 1.380 02/20/2020   Lab Results  Component Value Date   WBC 15.8 (H) 08/02/2020   HGB 12.7 08/02/2020   HCT 39.8 08/02/2020   MCV 78.7 (L) 08/02/2020   PLT 276 08/02/2020   Lab Results  Component Value Date   NA 137 08/02/2020   K 3.1 (L) 08/02/2020   CO2 21 (L) 08/02/2020   GLUCOSE 99 08/02/2020   BUN 12 08/02/2020   CREATININE 0.71 08/02/2020   BILITOT 0.3 02/20/2020   ALKPHOS 66 02/20/2020   AST 12 02/20/2020   ALT 12 02/20/2020   PROT 7.4  02/20/2020   ALBUMIN 4.2 02/20/2020   CALCIUM 9.5 08/02/2020   ANIONGAP 14 08/02/2020   Lab Results  Component Value Date   CHOL 126 02/20/2020   Lab Results  Component Value Date  HDL 40 02/20/2020   Lab Results  Component Value Date   LDLCALC 62 02/20/2020   Lab Results  Component Value Date   TRIG 133 02/20/2020   Lab Results  Component Value Date   CHOLHDL 3.2 02/20/2020   Lab Results  Component Value Date   HGBA1C 5.6 05/15/2020      Assessment & Plan:   Problem List Items Addressed This Visit   None   Visit Diagnoses    Obesity (BMI 30-39.9)    -  Primary   Relevant Medications   phentermine 15 MG capsule   Anxiety with depression       Relevant Medications   FLUoxetine (PROZAC) 40 MG capsule   hydrOXYzine (ATARAX/VISTARIL) 10 MG tablet      Meds ordered this encounter  Medications  . FLUoxetine (PROZAC) 40 MG capsule    Sig: Take 1 capsule (40 mg total) by mouth daily.    Dispense:  90 capsule    Refill:  1    Order Specific Question:   Supervising Provider    Answer:   Neva Seat, JEFFREY R [[id number]]  . hydrOXYzine (ATARAX/VISTARIL) 10 MG tablet    Sig: Take 1 tablet (10 mg total) by mouth 3 (three) times daily as needed.    Dispense:  30 tablet    Refill:  0    Order Specific Question:   Supervising Provider    Answer:   Neva Seat, JEFFREY R [[id number]]  . phentermine 15 MG capsule    Sig: Take 1 capsule (15 mg total) by mouth every morning.    Dispense:  30 capsule    Refill:  0    Order Specific Question:   Supervising Provider    Answer:   Neva Seat, JEFFREY R [[id number]]    Follow-up: No follow-ups on file.   PLAN  Start phentermine 15mg  PO qd. Discussed r/b/se. Pt agrees to stop with AEs.   Increase fluoxetine to 40mg  PO qd. Hydroxyzine 5-10mg  PO qhs PRN for sleep.  Return in 3-6 mo for weight check and check on htn  Patient encouraged to call clinic with any questions, comments, or concerns.  , NP

## 2020-09-05 NOTE — Patient Instructions (Addendum)
Ms. Mcnay    Always great to see you  Let's increase fluoxetine to 40mg  PO qd. Start hydroxyzine 1/2-1 tablet about 1 hour before bedtime as needed for sleep.   We can start the phentermine 15mg  PO qd. Take this in the morning as it is a stimulant.   If there's any side effects, let me know, and we can adjust as needed.  Thank you  Rich     If you have lab work done today you will be contacted with your lab results within the next 2 weeks.  If you have not heard from then please contact . The fastest way to get your results is to register for My Chart.   IF you received an x-ray today, you will receive an invoice from [facility]. Please contact [facility] at [phone] with questions or concerns regarding your invoice.   IF you received labwork today, you will receive an invoice from [facility]. Please contact [facility] at [phone] with questions or concerns regarding your invoice.   Our billing staff will not be able to assist you with questions regarding bills from these companies.  You will be contacted with the lab results as soon as they are available. The fastest way to get your results is to activate your My Chart account. Instructions are located on the last page of this paperwork. If you have not heard from [facility] regarding the results in 2 weeks, please contact this office.

## 2020-10-03 ENCOUNTER — Telehealth: Payer: Self-pay | Admitting: Registered Nurse

## 2020-10-03 NOTE — Telephone Encounter (Signed)
..  Medication Refills  Last OV:  Medication:  Phentermine  Pharmacy:  [facility] - [address]  Let patient know to contact pharmacy at the end of the day to make sure medication is ready.   Please notify patient to allow 48-72 hours to process.  Encourage patient to contact the pharmacy for refills or they can request refills through [facility]  Clinical Fills out below:   Last refill:  QTY:  Refill Date:    Other Comments:   Okay for refill?  Please advise.

## 2020-10-04 ENCOUNTER — Other Ambulatory Visit: Payer: Self-pay

## 2020-10-04 DIAGNOSIS — E669 Obesity, unspecified: Secondary | ICD-10-CM

## 2020-10-04 MED ORDER — PHENTERMINE HCL 15 MG PO CAPS: 15.0000 mg | ORAL_CAPSULE | ORAL | 0 refills | Status: DC

## 2020-10-04 NOTE — Telephone Encounter (Signed)
Sent to pcp for approval 

## 2020-10-04 NOTE — Telephone Encounter (Signed)
LFD 09/05/20 #30 with no refills LOV 09/05/20 NOV none

## 2020-11-15 ENCOUNTER — Other Ambulatory Visit: Payer: Self-pay

## 2020-11-15 ENCOUNTER — Telehealth: Payer: Self-pay

## 2020-11-15 DIAGNOSIS — E669 Obesity, unspecified: Secondary | ICD-10-CM

## 2020-11-15 MED ORDER — PHENTERMINE HCL 15 MG PO CAPS: 15.0000 mg | ORAL_CAPSULE | ORAL | 0 refills | Status: DC

## 2020-11-15 NOTE — Telephone Encounter (Signed)
Pt needs refill on phentermine 15 MG capsule pt is wanting to see if she can get 90 day supply for insurance savings   Pt wants to uses [facility] [address]    Call back [phone]      Note   Last filled 10/04/20 #30 with no refills Last visit 09/05/20 Next visit none

## 2020-11-15 NOTE — Telephone Encounter (Signed)
Pt needs refill on phentermine 15 MG capsule pt is wanting to see if she can get 90 day supply for insurance savings   Pt wants to uses [facility] [address]    Call back [phone]

## 2020-11-15 NOTE — Telephone Encounter (Signed)
Request sent to provider for approval.  

## 2020-12-30 ENCOUNTER — Telehealth: Payer: Self-pay

## 2020-12-30 NOTE — Telephone Encounter (Signed)
Pt needs refill on phentermine 15 MG capsule    [id number] - [place], [place] - [address] AT [facility] [address]   Pt last appt 09/05/20  Pt is making follow up appt

## 2020-12-31 ENCOUNTER — Other Ambulatory Visit: Payer: Self-pay

## 2020-12-31 DIAGNOSIS — E669 Obesity, unspecified: Secondary | ICD-10-CM

## 2020-12-31 MED ORDER — PHENTERMINE HCL 15 MG PO CAPS: 15.0000 mg | ORAL_CAPSULE | ORAL | 0 refills | Status: DC

## 2020-12-31 NOTE — Telephone Encounter (Signed)
Request sent to provider for approval.  

## 2020-12-31 NOTE — Telephone Encounter (Signed)
Pt needs refill on phentermine 15 MG capsule     [facility] #[id number] - [place], [place] - [address] AT [facility] [address]    Pt last appt 09/05/20   Pt is making follow up appt   LFD 11/15/20 #30 with no refills LOV 09/05/20 NOV 01/10/21

## 2021-01-10 ENCOUNTER — Ambulatory Visit: Payer: 59 | Admitting: Registered Nurse

## 2021-01-21 ENCOUNTER — Ambulatory Visit (INDEPENDENT_AMBULATORY_CARE_PROVIDER_SITE_OTHER): Payer: 59 | Admitting: Registered Nurse

## 2021-01-21 ENCOUNTER — Encounter: Payer: Self-pay | Admitting: Registered Nurse

## 2021-01-21 ENCOUNTER — Other Ambulatory Visit: Payer: Self-pay

## 2021-01-21 VITALS — BP 158/92 | HR 84 | Temp 98.3°F | Resp 18 | Ht 63.0 in | Wt 207.6 lb

## 2021-01-21 DIAGNOSIS — I10 Essential (primary) hypertension: Secondary | ICD-10-CM

## 2021-01-21 DIAGNOSIS — F418 Other specified anxiety disorders: Secondary | ICD-10-CM

## 2021-01-21 DIAGNOSIS — E669 Obesity, unspecified: Secondary | ICD-10-CM

## 2021-01-21 MED ORDER — BUPROPION HCL ER (XL) 300 MG PO TB24: 300.0000 mg | ORAL_TABLET | Freq: Every day | ORAL | 0 refills | Status: DC

## 2021-01-21 MED ORDER — PHENTERMINE HCL 37.5 MG PO CAPS
37.5000 mg | ORAL_CAPSULE | ORAL | 2 refills | Status: DC
Start: 2021-01-21 — End: 2021-03-11

## 2021-01-21 MED ORDER — BUPROPION HCL ER (XL) 150 MG PO TB24: 150.0000 mg | ORAL_TABLET | Freq: Every day | ORAL | 0 refills | Status: DC

## 2021-01-21 MED ORDER — AMLODIPINE BESY-BENAZEPRIL HCL 10-20 MG PO CAPS: 1.0000 | ORAL_CAPSULE | Freq: Every day | ORAL | 1 refills | Status: DC

## 2021-01-21 MED ORDER — SEMAGLUTIDE-WEIGHT MANAGEMENT 0.25 MG/0.5ML ~~LOC~~ SOAJ: 0.5000 mg | SUBCUTANEOUS | 0 refills | Status: DC

## 2021-01-21 NOTE — Progress Notes (Signed)
Established Patient Office Visit  Subjective:  Patient ID: [name], female    DOB: 1991-05-04  Age: 30 y.o. MRN: [id number]  CC:  Chief Complaint  Patient presents with   Follow-up    Patient states she is here for follow up for weight loss she states she was down 197 and went back up to 207.    HPI [name] presents for follow up   Weight loss  On phentermine 15 mg po qd.  Has not been effective.  Notes that weight had been down but has since yo-yoed back up to 207.6 Interested in other options.   Past Medical History:  Diagnosis Date   Anemia    Phreesia 02/19/2020   Hypertension    Phreesia 02/19/2020    Past Surgical History:  Procedure Laterality Date   CESAREAN SECTION  2012    Family History  Problem Relation Age of Onset   Hypertension Mother     Social History   Socioeconomic History   Marital status: Single    Spouse name: Not on file   Number of children: 1   Years of education: Not on file   Highest education level: Not on file  Occupational History   Not on file  Tobacco Use   Smoking status: Never   Smokeless tobacco: Never  Vaping Use   Vaping Use: Never used  Substance and Sexual Activity   Alcohol use: Yes   Drug use: No   Sexual activity: Yes    Birth control/protection: None  Other Topics Concern   Not on file  Social History Narrative   Marital status: engaged; moved to [place] from [place] in 2000.        Children;  One child (9yo)      Lives:  With husband, son.      Employment:  [employer], studies sociology      Tobacco: none       Alcohol: wine on weekends      Drugs:  none   Social Determinants of Corporate investment banker Strain: Not on file  Food Insecurity: No Food Insecurity   Worried About Programme researcher, broadcasting/film/video in the Last Year: Never true   Barista in the Last Year: Never true  Transportation Needs: No Transportation Needs   Lack of Transportation (Medical): No   Lack of  Transportation (Non-Medical): No  Physical Activity: Not on file  Stress: Not on file  Social Connections: Not on file  Intimate Partner Violence: Not on file    Outpatient Medications Prior to Visit  Medication Sig Dispense Refill   ferrous sulfate (FERROUSUL) 325 (65 FE) MG tablet Take 1 tablet (325 mg total) by mouth 2 (two) times daily. 60 tablet 1   FLUoxetine (PROZAC) 40 MG capsule Take 1 capsule (40 mg total) by mouth daily. 90 capsule 1   hydrOXYzine (ATARAX/VISTARIL) 10 MG tablet Take 1 tablet (10 mg total) by mouth 3 (three) times daily as needed. 30 tablet 0   ibuprofen (ADVIL) 200 MG tablet Take 400 mg by mouth every 6 (six) hours as needed for mild pain.     hydrochlorothiazide (HYDRODIURIL) 25 MG tablet Take 1 tablet (25 mg total) by mouth daily. 30 tablet 3   phentermine 15 MG capsule Take 1 capsule (15 mg total) by mouth every morning. 30 capsule 0   No facility-administered medications prior to visit.    No Known Allergies  ROS Review of Systems  Constitutional:  Negative.   HENT: Negative.    Eyes: Negative.   Respiratory: Negative.    Cardiovascular: Negative.   Gastrointestinal: Negative.   Genitourinary: Negative.   Musculoskeletal: Negative.   Skin: Negative.   Neurological: Negative.   Psychiatric/Behavioral: Negative.    All other systems reviewed and are negative.    Objective:    Physical Exam Vitals and nursing note reviewed.  Constitutional:      General: She is not in acute distress.    Appearance: Normal appearance. She is normal weight. She is not ill-appearing, toxic-appearing or diaphoretic.  Cardiovascular:     Rate and Rhythm: Normal rate and regular rhythm.     Heart sounds: Normal heart sounds. No murmur heard.   No friction rub. No gallop.  Pulmonary:     Effort: Pulmonary effort is normal. No respiratory distress.     Breath sounds: Normal breath sounds. No stridor. No wheezing, rhonchi or rales.  Chest:     Chest wall: No  tenderness.  Skin:    General: Skin is warm and dry.  Neurological:     General: No focal deficit present.     Mental Status: She is alert and oriented to person, place, and time. Mental status is at baseline.  Psychiatric:        Mood and Affect: Mood normal.        Behavior: Behavior normal.        Thought Content: Thought content normal.        Judgment: Judgment normal.    BP (!) 158/92   Pulse 84   Temp 98.3 F (36.8 C) (Temporal)   Resp 18   Ht 5\' 3"  (1.6 m)   Wt 207 lb 9.6 oz (94.2 kg)   SpO2 99%   BMI 36.77 kg/m  Wt Readings from Last 3 Encounters:  01/21/21 207 lb 9.6 oz (94.2 kg)  09/05/20 209 lb 3.2 oz (94.9 kg)  07/24/20 213 lb (96.6 kg)     There are no preventive care reminders to display for this patient.  There are no preventive care reminders to display for this patient.  Lab Results  Component Value Date   TSH 1.380 02/20/2020   Lab Results  Component Value Date   WBC 15.8 (H) 08/02/2020   HGB 12.7 08/02/2020   HCT 39.8 08/02/2020   MCV 78.7 (L) 08/02/2020   PLT 276 08/02/2020   Lab Results  Component Value Date   NA 137 08/02/2020   K 3.1 (L) 08/02/2020   CO2 21 (L) 08/02/2020   GLUCOSE 99 08/02/2020   BUN 12 08/02/2020   CREATININE 0.71 08/02/2020   BILITOT 0.3 02/20/2020   ALKPHOS 66 02/20/2020   AST 12 02/20/2020   ALT 12 02/20/2020   PROT 7.4 02/20/2020   ALBUMIN 4.2 02/20/2020   CALCIUM 9.5 08/02/2020   ANIONGAP 14 08/02/2020   Lab Results  Component Value Date   CHOL 126 02/20/2020   Lab Results  Component Value Date   HDL 40 02/20/2020   Lab Results  Component Value Date   LDLCALC 62 02/20/2020   Lab Results  Component Value Date   TRIG 133 02/20/2020   Lab Results  Component Value Date   CHOLHDL 3.2 02/20/2020   Lab Results  Component Value Date   HGBA1C 5.6 05/15/2020      Assessment & Plan:   Problem List Items Addressed This Visit   None Visit Diagnoses     Anxiety with depression    -  Primary   Relevant Medications   buPROPion (WELLBUTRIN XL) 150 MG 24 hr tablet   buPROPion (WELLBUTRIN XL) 300 MG 24 hr tablet   Obesity (BMI 30-39.9)       Relevant Medications   phentermine 37.5 MG capsule   buPROPion (WELLBUTRIN XL) 150 MG 24 hr tablet   Semaglutide-Weight Management 0.25 MG/0.5ML SOAJ   buPROPion (WELLBUTRIN XL) 300 MG 24 hr tablet   Hypertension, unspecified type       Relevant Medications   amLODipine-benazepril (LOTREL) 10-20 MG capsule       Meds ordered this encounter  Medications   phentermine 37.5 MG capsule    Sig: Take 1 capsule (37.5 mg total) by mouth every morning.    Dispense:  30 capsule    Refill:  2    Order Specific Question:   Supervising Provider    Answer:   Neva Seat, JEFFREY R [[id number]]   buPROPion (WELLBUTRIN XL) 150 MG 24 hr tablet    Sig: Take 1 tablet (150 mg total) by mouth daily for 14 days.    Dispense:  14 tablet    Refill:  0    Order Specific Question:   Supervising Provider    Answer:   Neva Seat, JEFFREY R [[id number]]   Semaglutide-Weight Management 0.25 MG/0.5ML SOAJ    Sig: Inject 0.5 mg into the skin once a week.    Dispense:  6 mL    Refill:  0    Order Specific Question:   Supervising Provider    Answer:   Neva Seat, JEFFREY R [[id number]]   amLODipine-benazepril (LOTREL) 10-20 MG capsule    Sig: Take 1 capsule by mouth daily.    Dispense:  90 capsule    Refill:  1    Order Specific Question:   Supervising Provider    Answer:   Neva Seat, JEFFREY R [[id number]]   buPROPion (WELLBUTRIN XL) 300 MG 24 hr tablet    Sig: Take 1 tablet (300 mg total) by mouth daily.    Dispense:  76 tablet    Refill:  0    Order Specific Question:   Supervising Provider    Answer:   Neva Seat, JEFFREY R [[id number]]    Follow-up: Return in about 3 months (around 04/22/2021) for med check.   PLAN Looking to better control BP to increase phentermine - will switch to amlodipine-benazepril 10-20mg  po qd. Discussed risks, benefits, and alternatives with patient who voiced  understanding. Return in 3 mo. Increase phentermine to 37.5mg  po qd. Monitor effect. Monitor home BP. If AE, pt to stop and return to office. Start bupropion 150mg  XL po qd. Increase to 300mg  XL PO qd if tolerating well.  Med check in 3 mo  Patient encouraged to call clinic with any questions, comments, or concerns.  , NP

## 2021-01-21 NOTE — Patient Instructions (Addendum)
Ms. [name] -   Randie Heinz to see you, as always.  Increase phentermine  Start Wellbutrin at 150mg  daily for two weeks. If tolerating well, increase to 300mg    Stop hydrochlorothiazide. Start amlodipine-benazepril. Let me know if any side effects occur.  Let's touch base in 3 mo  Keep up healthy diet and routine exercise as able.  Thank you  Rich     If you have lab work done today you will be contacted with your lab results within the next 2 weeks.  If you have not heard from then please contact . The fastest way to get your results is to register for My Chart.   IF you received an x-ray today, you will receive an invoice from [facility]. Please contact [facility] at [phone] with questions or concerns regarding your invoice.   IF you received labwork today, you will receive an invoice from [facility]. Please contact [facility] at [phone] with questions or concerns regarding your invoice.   Our billing staff will not be able to assist you with questions regarding bills from these companies.  You will be contacted with the lab results as soon as they are available. The fastest way to get your results is to activate your My Chart account. Instructions are located on the last page of this paperwork. If you have not heard from [facility] regarding the results in 2 weeks, please contact this office.

## 2021-01-24 ENCOUNTER — Telehealth: Payer: Self-pay

## 2021-01-24 NOTE — Telephone Encounter (Signed)
Patient called in regards to her Rx Semaglutide. She states that the med is $4,500 and that she cannot afford that price and if there is something else that she can take for weight loss. Please advise

## 2021-01-27 ENCOUNTER — Other Ambulatory Visit: Payer: Self-pay | Admitting: Registered Nurse

## 2021-01-27 DIAGNOSIS — E669 Obesity, unspecified: Secondary | ICD-10-CM

## 2021-01-27 DIAGNOSIS — Z8639 Personal history of other endocrine, nutritional and metabolic disease: Secondary | ICD-10-CM

## 2021-01-27 MED ORDER — LIRAGLUTIDE -WEIGHT MANAGEMENT 18 MG/3ML ~~LOC~~ SOPN: PEN_INJECTOR | SUBCUTANEOUS | 0 refills | Status: AC

## 2021-01-27 NOTE — Telephone Encounter (Signed)
Will try liraglutide.  If PA is available for either I'd pursue that on her behalf.   Can try orlitstat - available OTC - top side effect is oily rectal discharge that often turns patients away from this  Thanks,  Rich

## 2021-01-27 NOTE — Telephone Encounter (Signed)
Called patient to discuss med options. Patient stated that she will try the phentermine and see how that works first. No further concerns at this time.

## 2021-02-23 NOTE — Telephone Encounter (Signed)
Thank you Rich!

## 2021-03-05 NOTE — Progress Notes (Signed)
Established Patient Office Visit  Subjective:  Patient ID: [name], female    DOB: 04-10-1991  Age: 30 y.o. MRN: [id number]  CC:  Chief Complaint  Patient presents with   Weight Check    Pt states that is is gaining weight due to the medication she is taking ( megace). Pt would like to know if there is another medication she can take. She dose not take medication any more, but still gaining weight.    HPI [name] presents for weight management  She notes that she had been taking megace for aub Has stopped since, bleeding managed However, notes that she had gained significant weight when taking the Megace. Would like to discuss options for lowering her weight, managing hunger, and improving energy.  Notably hypertensive today with BP 164/131. She has been off of antihypertensives. She is aware that weight gain is likely contributory to elevated BP.   Past Medical History:  Diagnosis Date   Anemia    Phreesia 02/19/2020   Hypertension    Phreesia 02/19/2020    Past Surgical History:  Procedure Laterality Date   CESAREAN SECTION  2012    Family History  Problem Relation Age of Onset   Hypertension Mother     Social History   Socioeconomic History   Marital status: Single    Spouse name: Not on file   Number of children: 1   Years of education: Not on file   Highest education level: Not on file  Occupational History   Not on file  Tobacco Use   Smoking status: Never   Smokeless tobacco: Never  Vaping Use   Vaping Use: Never used  Substance and Sexual Activity   Alcohol use: Yes   Drug use: No   Sexual activity: Yes    Birth control/protection: None  Other Topics Concern   Not on file  Social History Narrative   Marital status: engaged; moved to [place] from [place] in 2000.        Children;  One child (9yo)      Lives:  With husband, son.      Employment:  [employer], studies sociology      Tobacco: none       Alcohol: wine on weekends       Drugs:  none   Social Determinants of Corporate investment banker Strain: Not on file  Food Insecurity: Not on file  Transportation Needs: Not on file  Physical Activity: Not on file  Stress: Not on file  Social Connections: Not on file  Intimate Partner Violence: Not on file    Outpatient Medications Prior to Visit  Medication Sig Dispense Refill   ferrous sulfate (FERROUSUL) 325 (65 FE) MG tablet Take 1 tablet (325 mg total) by mouth 2 (two) times daily. 60 tablet 1   FLUoxetine (PROZAC) 20 MG capsule Take 1 capsule (20 mg total) by mouth daily. (Patient not taking: No sig reported) 90 capsule 0   hydrochlorothiazide (HYDRODIURIL) 25 MG tablet Take 1 tablet (25 mg total) by mouth daily. 30 tablet 3   benzonatate (TESSALON) 100 MG capsule Take 1 capsule (100 mg total) by mouth 2 (two) times daily as needed for cough. (Patient not taking: Reported on 07/24/2020) 20 capsule 0   HYDROcodone-homatropine (HYCODAN) 5-1.5 MG/5ML syrup Take 5 mLs by mouth every 8 (eight) hours as needed for cough. 120 mL 0   megestrol (MEGACE) 40 MG tablet Take 1 tablet (40 mg total) by mouth daily.  Can increase to one tablet twice a day in the event of heavy bleeding (Patient not taking: No sig reported) 60 tablet 5   No facility-administered medications prior to visit.    No Known Allergies  ROS Review of Systems  Constitutional: Negative.   HENT: Negative.    Eyes: Negative.   Respiratory: Negative.    Cardiovascular: Negative.   Gastrointestinal: Negative.   Genitourinary: Negative.   Musculoskeletal: Negative.   Skin: Negative.   Neurological: Negative.   Psychiatric/Behavioral: Negative.    All other systems reviewed and are negative.    Objective:    Physical Exam Vitals and nursing note reviewed.  Constitutional:      General: She is not in acute distress.    Appearance: Normal appearance. She is normal weight. She is not ill-appearing, toxic-appearing or diaphoretic.   Cardiovascular:     Rate and Rhythm: Normal rate and regular rhythm.     Heart sounds: Normal heart sounds. No murmur heard.   No friction rub. No gallop.  Pulmonary:     Effort: Pulmonary effort is normal. No respiratory distress.     Breath sounds: Normal breath sounds. No stridor. No wheezing, rhonchi or rales.  Chest:     Chest wall: No tenderness.  Skin:    General: Skin is warm and dry.  Neurological:     General: No focal deficit present.     Mental Status: She is alert and oriented to person, place, and time. Mental status is at baseline.  Psychiatric:        Mood and Affect: Mood normal.        Behavior: Behavior normal.        Thought Content: Thought content normal.        Judgment: Judgment normal.    BP (!) 164/131   Pulse 83   Temp 97.6 F (36.4 C) (Temporal)   Resp 16   Ht 5\' 3"  (1.6 m)   Wt 213 lb (96.6 kg)   SpO2 100%   BMI 37.73 kg/m  Wt Readings from Last 3 Encounters:  01/21/21 207 lb 9.6 oz (94.2 kg)  09/05/20 209 lb 3.2 oz (94.9 kg)  07/24/20 213 lb (96.6 kg)     Health Maintenance Due  Topic Date Due   COVID-19 Vaccine (1) Never done    There are no preventive care reminders to display for this patient.  Lab Results  Component Value Date   TSH 1.380 02/20/2020   Lab Results  Component Value Date   WBC 15.8 (H) 08/02/2020   HGB 12.7 08/02/2020   HCT 39.8 08/02/2020   MCV 78.7 (L) 08/02/2020   PLT 276 08/02/2020   Lab Results  Component Value Date   NA 137 08/02/2020   K 3.1 (L) 08/02/2020   CO2 21 (L) 08/02/2020   GLUCOSE 99 08/02/2020   BUN 12 08/02/2020   CREATININE 0.71 08/02/2020   BILITOT 0.3 02/20/2020   ALKPHOS 66 02/20/2020   AST 12 02/20/2020   ALT 12 02/20/2020   PROT 7.4 02/20/2020   ALBUMIN 4.2 02/20/2020   CALCIUM 9.5 08/02/2020   ANIONGAP 14 08/02/2020   Lab Results  Component Value Date   CHOL 126 02/20/2020   Lab Results  Component Value Date   HDL 40 02/20/2020   Lab Results  Component Value  Date   LDLCALC 62 02/20/2020   Lab Results  Component Value Date   TRIG 133 02/20/2020   Lab Results  Component Value Date  CHOLHDL 3.2 02/20/2020   Lab Results  Component Value Date   HGBA1C 5.6 05/15/2020      Assessment & Plan:   Problem List Items Addressed This Visit   None Visit Diagnoses     Essential hypertension    -  Primary   Weight gain           No orders of the defined types were placed in this encounter.   Follow-up: No follow-ups on file.   She declines antihypertensives at this time. Reviewed in depth the risks of this decision. We had an in depth discussion of lifestyle management of weight. She will need to have much better control of htn before we can consider adding agents to promote weight loss, ie phentermine Patient encouraged to call clinic with any questions, comments, or concerns.  Janeece Agee, NP

## 2021-03-11 ENCOUNTER — Other Ambulatory Visit: Payer: Self-pay

## 2021-03-11 DIAGNOSIS — E669 Obesity, unspecified: Secondary | ICD-10-CM

## 2021-03-11 MED ORDER — PHENTERMINE HCL 37.5 MG PO CAPS: 37.5000 mg | ORAL_CAPSULE | ORAL | 2 refills | Status: DC

## 2021-03-11 NOTE — Telephone Encounter (Signed)
Caller name:Katheryn Wilemon  On DPR? :No   Call back number:[phone]  Provider they see: Richard  Reason for call:phentermine 37.5 MG capsule    [id number] - [place], [place] - [address] AT [facility] [address]

## 2021-03-11 NOTE — Telephone Encounter (Signed)
Requesting:Phentermine 37.5 mg Contract: UDS: Last Visit:01/21/21 Next Visit:n/a Last Refill:was printed today 30 tabs 2 refills  Please Advise

## 2021-04-07 ENCOUNTER — Other Ambulatory Visit: Payer: Self-pay | Admitting: Registered Nurse

## 2021-04-07 DIAGNOSIS — F418 Other specified anxiety disorders: Secondary | ICD-10-CM

## 2021-05-06 ENCOUNTER — Telehealth: Payer: Self-pay | Admitting: Registered Nurse

## 2021-05-06 NOTE — Telephone Encounter (Signed)
Pt called in asking for a refill on the Phentermine, pt uses [facility].   Please advise

## 2021-05-07 NOTE — Telephone Encounter (Signed)
Lm asking pt to call back to schedule a Return in about 3 months (around 04/22/2021) for med check per last office visit in 01/2021.

## 2021-05-27 ENCOUNTER — Encounter: Payer: Self-pay | Admitting: Registered Nurse

## 2021-05-27 ENCOUNTER — Telehealth (INDEPENDENT_AMBULATORY_CARE_PROVIDER_SITE_OTHER): Payer: Managed Care, Other (non HMO) | Admitting: Registered Nurse

## 2021-05-27 ENCOUNTER — Other Ambulatory Visit: Payer: Self-pay

## 2021-05-27 DIAGNOSIS — J22 Unspecified acute lower respiratory infection: Secondary | ICD-10-CM

## 2021-05-27 MED ORDER — PREDNISONE 10 MG (21) PO TBPK: ORAL_TABLET | ORAL | 0 refills | Status: DC

## 2021-05-27 MED ORDER — DOXYCYCLINE HYCLATE 100 MG PO TABS: 100.0000 mg | ORAL_TABLET | Freq: Two times a day (BID) | ORAL | 0 refills | Status: DC

## 2021-05-27 NOTE — Patient Instructions (Signed)
° ° ° °  If you have lab work done today you will be contacted with your lab results within the next 2 weeks.  If you have not heard from us then please contact us. The fastest way to get your results is to register for My Chart. ° ° °IF you received an x-ray today, you will receive an invoice from [facility]. Please contact [facility] at [phone] with questions or concerns regarding your invoice.  ° °IF you received labwork today, you will receive an invoice from [facility]. Please contact [facility] at [phone] with questions or concerns regarding your invoice.  ° °Our billing staff will not be able to assist you with questions regarding bills from these companies. ° °You will be contacted with the lab results as soon as they are available. The fastest way to get your results is to activate your My Chart account. Instructions are located on the last page of this paperwork. If you have not heard from us regarding the results in 2 weeks, please contact this office. °  ° ° ° °

## 2021-05-27 NOTE — Progress Notes (Signed)
Telemedicine Encounter- SOAP NOTE Established Patient  This telephone encounter was conducted with the patient's (or proxy's) verbal consent via audio telecommunications: yes/no: Yes Patient was instructed to have this encounter in a suitably private space; and to only have persons present to whom they give permission to participate. In addition, patient identity was confirmed by use of name plus two identifiers (DOB and address).  I discussed the limitations, risks, security and privacy concerns of performing an evaluation and management service by telephone and the availability of in person appointments. I also discussed with the patient that there may be a patient responsible charge related to this service. The patient expressed understanding and agreed to proceed.  I spent a total of 14 minutes talking with the patient or their proxy.  Patient at home Provider in office  Participants: Jari Sportsman, NP and Marilynn Rail  Chief Complaint  Patient presents with   Cough    Patient states she has been experiencing a cough, chills , congestion , and body aches for about 3 weeks. Patient took a covid test that was negative.    Subjective   [name] is a 31 y.o. established patient. Telephone visit today for cough  HPI 3 weeks ago First symptoms with malaise, sore throat Tx with conservative management Cough seems to be progressing from dry and now deep, rattling, productive Has had some feverish/chills feeling. Nasal congestion Some shob Some body aches, headaches (worse with coughing) No nvd   Patient Active Problem List   Diagnosis Date Noted   Iron deficiency anemia due to chronic menstrual blood loss 02/15/2020    Past Medical History:  Diagnosis Date   Anemia    Phreesia 02/19/2020   Hypertension    Phreesia 02/19/2020    Current Outpatient Medications  Medication Sig Dispense Refill   amLODipine-benazepril (LOTREL) 10-20 MG capsule Take 1 capsule by mouth  daily. 90 capsule 1   buPROPion (WELLBUTRIN XL) 300 MG 24 hr tablet Take 1 tablet (300 mg total) by mouth daily. 76 tablet 0   doxycycline (VIBRA-TABS) 100 MG tablet Take 1 tablet (100 mg total) by mouth 2 (two) times daily. 20 tablet 0   ferrous sulfate (FERROUSUL) 325 (65 FE) MG tablet Take 1 tablet (325 mg total) by mouth 2 (two) times daily. 60 tablet 1   FLUoxetine (PROZAC) 40 MG capsule TAKE 1 CAPSULE(40 MG) BY MOUTH DAILY 90 capsule 1   hydrOXYzine (ATARAX/VISTARIL) 10 MG tablet Take 1 tablet (10 mg total) by mouth 3 (three) times daily as needed. 30 tablet 0   ibuprofen (ADVIL) 200 MG tablet Take 400 mg by mouth every 6 (six) hours as needed for mild pain.     phentermine 37.5 MG capsule Take 1 capsule (37.5 mg total) by mouth every morning. 30 capsule 2   predniSONE (STERAPRED UNI-PAK 21 TAB) 10 MG (21) TBPK tablet Take per package instructions. Do not skip doses. Finish entire supply. 1 each 0   buPROPion (WELLBUTRIN XL) 150 MG 24 hr tablet Take 1 tablet (150 mg total) by mouth daily for 14 days. 14 tablet 0   No current facility-administered medications for this visit.    Not on File  Social History   Socioeconomic History   Marital status: Single    Spouse name: Not on file   Number of children: 1   Years of education: Not on file   Highest education level: Not on file  Occupational History   Not on file  Tobacco Use  Smoking status: Never   Smokeless tobacco: Never  Vaping Use   Vaping Use: Never used  Substance and Sexual Activity   Alcohol use: Yes   Drug use: No   Sexual activity: Yes    Birth control/protection: None  Other Topics Concern   Not on file  Social History Narrative   Marital status: engaged; moved to [place] from [place] in 2000.        Children;  One child (9yo)      Lives:  With husband, son.      Employment:  [employer], studies sociology      Tobacco: none       Alcohol: wine on weekends      Drugs:  none   Social Determinants of  Corporate investment banker Strain: Not on file  Food Insecurity: Not on file  Transportation Needs: Not on file  Physical Activity: Not on file  Stress: Not on file  Social Connections: Not on file  Intimate Partner Violence: Not on file    Review of Systems  Constitutional:  Positive for chills, fever and malaise/fatigue.  HENT:  Positive for congestion and sore throat.   Eyes: Negative.   Respiratory:  Positive for cough, sputum production and shortness of breath. Negative for hemoptysis and wheezing.   Cardiovascular: Negative.   Gastrointestinal: Negative.   Genitourinary: Negative.   Musculoskeletal: Negative.   Skin: Negative.  Negative for itching and rash.  Neurological: Negative.   Endo/Heme/Allergies: Negative.   Psychiatric/Behavioral: Negative.    All other systems reviewed and are negative.  Objective   Vitals as reported by the patient: There were no vitals filed for this visit.  [name] was seen today for cough.  Diagnoses and all orders for this visit:  Acute lower respiratory infection -     doxycycline (VIBRA-TABS) 100 MG tablet; Take 1 tablet (100 mg total) by mouth 2 (two) times daily. -     predniSONE (STERAPRED UNI-PAK 21 TAB) 10 MG (21) TBPK tablet; Take per package instructions. Do not skip doses. Finish entire supply.    PLAN Given duration of symptoms and worsening symptoms, will tx as bacterial infection as above. Discussed supportive care with continued use of OTCs, rest, hydration, and deep breathing exercise. Patient encouraged to call clinic with any questions, comments, or concerns.  I discussed the assessment and treatment plan with the patient. The patient was provided an opportunity to ask questions and all were answered. The patient agreed with the plan and demonstrated an understanding of the instructions.   The patient was advised to call back or seek an in-person evaluation if the symptoms worsen or if the condition fails to  improve as anticipated.  I provided 14 minutes of non-face-to-face time during this encounter.  Maureen Agee, NP

## 2021-06-21 NOTE — Progress Notes (Signed)
Established Patient Office Visit  Subjective:  Patient ID: [name], female    DOB: 13-Feb-1991  Age: 31 y.o. MRN: [id number]  CC:  Chief Complaint  Patient presents with   period is still on    Since first week of Dec   Weight Gain    Has been trying to loose weight and the opposite is happening    HPI [name] presents for abnormal menses, weight gain  Abnormal menses Has been going on for a number of weeks Unclear what is happening Denies symptoms of anemia, vaginal symptoms, other constitutional symptoms No clotting.  Weight gain Has been gaining weight despite diet and exercise Interested in options for weight management.   Past Medical History:  Diagnosis Date   Anemia    Phreesia 02/19/2020   Hypertension    Phreesia 02/19/2020    Past Surgical History:  Procedure Laterality Date   CESAREAN SECTION  2012    Family History  Problem Relation Age of Onset   Hypertension Mother     Social History   Socioeconomic History   Marital status: Single    Spouse name: Not on file   Number of children: 1   Years of education: Not on file   Highest education level: Not on file  Occupational History   Not on file  Tobacco Use   Smoking status: Never   Smokeless tobacco: Never  Vaping Use   Vaping Use: Never used  Substance and Sexual Activity   Alcohol use: Yes   Drug use: No   Sexual activity: Yes    Birth control/protection: None  Other Topics Concern   Not on file  Social History Narrative   Marital status: engaged; moved to [place] from [place] in 2000.        Children;  One child (9yo)      Lives:  With husband, son.      Employment:  [employer], studies sociology      Tobacco: none       Alcohol: wine on weekends      Drugs:  none   Social Determinants of Corporate investment banker Strain: Not on file  Food Insecurity: Not on file  Transportation Needs: Not on file  Physical Activity: Not on file  Stress: Not on file   Social Connections: Not on file  Intimate Partner Violence: Not on file    Outpatient Medications Prior to Visit  Medication Sig Dispense Refill   ferrous sulfate (FERROUSUL) 325 (65 FE) MG tablet Take 1 tablet (325 mg total) by mouth 2 (two) times daily. 60 tablet 1   cyclobenzaprine (FLEXERIL) 10 MG tablet Take 1 tablet (10 mg total) by mouth at bedtime. 7 tablet 0   FLUoxetine (PROZAC) 20 MG capsule Take 1 capsule (20 mg total) by mouth daily. (Patient not taking: No sig reported) 90 capsule 0   hydrochlorothiazide (HYDRODIURIL) 25 MG tablet Take 1 tablet (25 mg total) by mouth daily. 30 tablet 3   megestrol (MEGACE) 40 MG tablet Take 1 tablet (40 mg total) by mouth daily. Can increase to one tablet twice a day in the event of heavy bleeding (Patient not taking: No sig reported) 60 tablet 5   ibuprofen (ADVIL,MOTRIN) 800 MG tablet Take 1 tablet (800 mg total) by mouth 3 (three) times daily. 21 tablet 0   No facility-administered medications prior to visit.    Not on File  ROS Review of Systems  Constitutional: Negative.   HENT: Negative.  Eyes: Negative.   Respiratory: Negative.    Cardiovascular: Negative.   Gastrointestinal: Negative.   Genitourinary: Negative.   Musculoskeletal: Negative.   Skin: Negative.   Neurological: Negative.   Psychiatric/Behavioral: Negative.    All other systems reviewed and are negative.    Objective:    Physical Exam Vitals and nursing note reviewed.  Constitutional:      General: She is not in acute distress.    Appearance: Normal appearance. She is normal weight. She is not ill-appearing, toxic-appearing or diaphoretic.  Cardiovascular:     Rate and Rhythm: Normal rate and regular rhythm.     Heart sounds: Normal heart sounds. No murmur heard.   No friction rub. No gallop.  Pulmonary:     Effort: Pulmonary effort is normal. No respiratory distress.     Breath sounds: Normal breath sounds. No stridor. No wheezing, rhonchi or rales.   Chest:     Chest wall: No tenderness.  Genitourinary:    Comments: Declined by pt Skin:    General: Skin is warm and dry.  Neurological:     General: No focal deficit present.     Mental Status: She is alert and oriented to person, place, and time. Mental status is at baseline.  Psychiatric:        Mood and Affect: Mood normal.        Behavior: Behavior normal.        Thought Content: Thought content normal.        Judgment: Judgment normal.    BP (!) 160/108    Pulse 98    Temp 97.7 F (36.5 C) (Temporal)    Ht 5\' 3"  (1.6 m)    Wt 206 lb 6.4 oz (93.6 kg)    LMP 04/05/2020    SpO2 100%    BMI 36.56 kg/m  Wt Readings from Last 3 Encounters:  01/21/21 207 lb 9.6 oz (94.2 kg)  09/05/20 209 lb 3.2 oz (94.9 kg)  07/24/20 213 lb (96.6 kg)     Health Maintenance Due  Topic Date Due   COVID-19 Vaccine (1) Never done   HIV Screening  Never done   Hepatitis C Screening  Never done   TETANUS/TDAP  Never done    There are no preventive care reminders to display for this patient.  Lab Results  Component Value Date   TSH 1.380 02/20/2020   Lab Results  Component Value Date   WBC 15.8 (H) 08/02/2020   HGB 12.7 08/02/2020   HCT 39.8 08/02/2020   MCV 78.7 (L) 08/02/2020   PLT 276 08/02/2020   Lab Results  Component Value Date   NA 137 08/02/2020   K 3.1 (L) 08/02/2020   CO2 21 (L) 08/02/2020   GLUCOSE 99 08/02/2020   BUN 12 08/02/2020   CREATININE 0.71 08/02/2020   BILITOT 0.3 02/20/2020   ALKPHOS 66 02/20/2020   AST 12 02/20/2020   ALT 12 02/20/2020   PROT 7.4 02/20/2020   ALBUMIN 4.2 02/20/2020   CALCIUM 9.5 08/02/2020   ANIONGAP 14 08/02/2020   Lab Results  Component Value Date   CHOL 126 02/20/2020   Lab Results  Component Value Date   HDL 40 02/20/2020   Lab Results  Component Value Date   LDLCALC 62 02/20/2020   Lab Results  Component Value Date   TRIG 133 02/20/2020   Lab Results  Component Value Date   CHOLHDL 3.2 02/20/2020   Lab Results   Component Value Date  HGBA1C 5.6 05/15/2020      Assessment & Plan:   Problem List Items Addressed This Visit   None Visit Diagnoses     Screening for endocrine, metabolic and immunity disorder    -  Primary   Relevant Orders   CBC (Completed)   Basic Metabolic Panel (Completed)   Iron, TIBC and Ferritin Panel (Completed)   Hemoglobin A1c (Completed)   Abnormal uterine bleeding (AUB)       Relevant Orders   CBC (Completed)   Basic Metabolic Panel (Completed)   Iron, TIBC and Ferritin Panel (Completed)   Hemoglobin A1c (Completed)       No orders of the defined types were placed in this encounter.   Follow-up: No follow-ups on file.   PLAN Labs as above Will have her follow up with Gyn provider for abnormal menses. Defer weight management conversation until labs return, menses return to normal Patient encouraged to call clinic with any questions, comments, or concerns.  Janeece Agee, NP

## 2021-07-29 ENCOUNTER — Telehealth (INDEPENDENT_AMBULATORY_CARE_PROVIDER_SITE_OTHER): Payer: Managed Care, Other (non HMO) | Admitting: Registered Nurse

## 2021-07-29 ENCOUNTER — Encounter: Payer: Self-pay | Admitting: Registered Nurse

## 2021-07-29 DIAGNOSIS — E669 Obesity, unspecified: Secondary | ICD-10-CM | POA: Diagnosis not present

## 2021-07-29 DIAGNOSIS — F418 Other specified anxiety disorders: Secondary | ICD-10-CM | POA: Diagnosis not present

## 2021-07-29 DIAGNOSIS — I10 Essential (primary) hypertension: Secondary | ICD-10-CM

## 2021-07-29 DIAGNOSIS — Z8639 Personal history of other endocrine, nutritional and metabolic disease: Secondary | ICD-10-CM | POA: Diagnosis not present

## 2021-07-29 MED ORDER — HYDROXYZINE HCL 10 MG PO TABS
10.0000 mg | ORAL_TABLET | Freq: Three times a day (TID) | ORAL | 0 refills | Status: DC | PRN
Start: 2021-07-29 — End: 2022-01-15

## 2021-07-29 MED ORDER — FERROUS SULFATE 325 (65 FE) MG PO TABS: 325.0000 mg | ORAL_TABLET | Freq: Two times a day (BID) | ORAL | 1 refills | Status: AC

## 2021-07-29 MED ORDER — SEMAGLUTIDE(0.25 OR 0.5MG/DOS) 2 MG/1.5ML ~~LOC~~ SOPN: 0.5000 mg | PEN_INJECTOR | SUBCUTANEOUS | 0 refills | Status: DC

## 2021-07-29 NOTE — Progress Notes (Signed)
? ? ?Telemedicine Encounter- SOAP NOTE Established Patient ? ?This telephone encounter was conducted with the patient's (or proxy's) verbal consent via audio telecommunications: yes/no: Yes ?Patient was instructed to have this encounter in a suitably private space; and to only have persons present to whom they give permission to participate. In addition, patient identity was confirmed by use of name plus two identifiers (DOB and address).  I discussed the limitations, risks, security and privacy concerns of performing an evaluation and management service by telephone and the availability of in person appointments. I also discussed with the patient that there may be a patient responsible charge related to this service. The patient expressed understanding and agreed to proceed. ? ?I spent a total of 13 minutes talking with the patient or their proxy. ? ?Patient at home ?Provider in office ? ?Participants: Jari Sportsman, NP and Marilynn Rail ? ?Chief Complaint  ?Patient presents with  ? Weight Check  ?  Was on phentermine for 5-6 months and it didn't help, wants to discuss other options  ? ? ?Subjective  ? ?[name] is a 31 y.o. established patient. Telephone visit today for weight check ? ?HPI ?Has been on phentermine for around 6 mo ?Has lost around 6 lbs but plateaued.  ?Has committed to diet and exercise for some time. Unfortunately limited in impact. ?Interested in options including bariatric surgery. ?We have discussed GLP1s in depth in the past. Considering this.  ? ?Does need refills on hydroxyzine and ferrous sulfate ?Good effect, no AE. ? ?Patient Active Problem List  ? Diagnosis Date Noted  ? Iron deficiency anemia due to chronic menstrual blood loss 02/15/2020  ? ? ?Past Medical History:  ?Diagnosis Date  ? Anemia   ? [employer]rees[employer] 02/19/2020  ? Hypertension   ? [employer]rees[employer] 02/19/2020  ? ? ?Current Outpatient Medications  ?Medication Sig Dispense Refill  ? amLODipine-benazepril (LOTREL) 10-20 MG capsule Take 1  capsule by mouth daily. 90 capsule 1  ? buPROPion (WELLBUTRIN XL) 300 MG 24 hr tablet Take 1 tablet (300 mg total) by mouth daily. 76 tablet 0  ? ibuprofen (ADVIL) 200 MG tablet Take 400 mg by mouth every 6 (six) hours as needed for mild pain.    ? buPROPion (WELLBUTRIN XL) 150 MG 24 hr tablet Take 1 tablet (150 mg total) by mouth daily for 14 days. 14 tablet 0  ? doxycycline (VIBRA-TABS) 100 MG tablet Take 1 tablet (100 mg total) by mouth 2 (two) times daily. (Patient not taking: Reported on 07/29/2021) 20 tablet 0  ? ferrous sulfate (FERROUSUL) 325 (65 FE) MG tablet Take 1 tablet (325 mg total) by mouth 2 (two) times daily. 60 tablet 1  ? FLUoxetine (PROZAC) 40 MG capsule TAKE 1 CAPSULE(40 MG) BY MOUTH DAILY (Patient not taking: Reported on 07/29/2021) 90 capsule 1  ? hydrOXYzine (ATARAX) 10 MG tablet Take 1 tablet (10 mg total) by mouth 3 (three) times daily as needed. 30 tablet 0  ? phentermine 37.5 MG capsule Take 1 capsule (37.5 mg total) by mouth every morning. (Patient not taking: Reported on 07/29/2021) 30 capsule 2  ? predniSONE (STERAPRED UNI-PAK 21 TAB) 10 MG (21) TBPK tablet Take per package instructions. Do not skip doses. Finish entire supply. (Patient not taking: Reported on 07/29/2021) 1 each 0  ? ?No current facility-administered medications for this visit.  ? ? ?No Known Allergies ? ?Social History  ? ?Socioeconomic History  ? Marital status: Single  ?  Spouse name: Not on file  ? Number  of children: 1  ? Years of education: Not on file  ? Highest education level: Not on file  ?Occupational History  ? Not on file  ?Tobacco Use  ? Smoking status: Never  ? Smokeless tobacco: Never  ?Vaping Use  ? Vaping Use: Never used  ?Substance and Sexual Activity  ? Alcohol use: Yes  ? Drug use: No  ? Sexual activity: Yes  ?  Birth control/protection: None  ?Other Topics Concern  ? Not on file  ?Social History Narrative  ? Marital status: engaged; moved to [place] from [place] in 2000.    ?    Children;  One child (9yo)   ?    Lives:  With husband, son.  ?    Employment:  [employer], studies sociology  ?    Tobacco: none  ?     Alcohol: wine on weekends  ?    Drugs:  none  ? ?Social Determinants of Health  ? ?Financial Resource Strain: Not on file  ?Food Insecurity: Not on file  ?Transportation Needs: Not on file  ?Physical Activity: Not on file  ?Stress: Not on file  ?Social Connections: Not on file  ?Intimate Partner Violence: Not on file  ? ? ?ROS ?Per hpi  ? ?Objective  ? ?Vitals as reported by the patient: ?There were no vitals filed for this visit. ? ?[name] was seen today for weight check. ? ?Diagnoses and all orders for this visit: ? ?Anxiety with depression ?-     hydrOXYzine (ATARAX) 10 MG tablet; Take 1 tablet (10 mg total) by mouth 3 (three) times daily as needed. ? ?Other orders ?-     ferrous sulfate (FERROUSUL) 325 (65 FE) MG tablet; Take 1 tablet (325 mg total) by mouth 2 (two) times daily. ? ? ?PLAN ?Will send semaglutide. Reviewed risks, benefits, and side effects, pt voices understanding. ?Med check in 3 mo ?Patient encouraged to call clinic with any questions, comments, or concerns. ? ? ?I discussed the assessment and treatment plan with the patient. The patient was provided an opportunity to ask questions and all were answered. The patient agreed with the plan and demonstrated an understanding of the instructions. ?  ?The patient was advised to call back or seek an in-person evaluation if the symptoms worsen or if the condition fails to improve as anticipated. ? ?I provided 13 minutes of non-face-to-face time during this encounter. ? ?Janeece Agee, NP ?

## 2021-07-30 ENCOUNTER — Telehealth: Payer: Self-pay

## 2021-07-30 NOTE — Telephone Encounter (Signed)
Patient is calling in stating that insurance is still denying the prescription for weight loss, [name] wants to know if there is another medication [name] would consider prescribing.  ?

## 2021-08-01 NOTE — Telephone Encounter (Signed)
Yes that would be good ? ?Thanks, ? ?Rich

## 2021-08-06 ENCOUNTER — Telehealth: Payer: Self-pay | Admitting: Registered Nurse

## 2021-08-06 NOTE — Telephone Encounter (Signed)
Chief Complaint Prescription Refill or Medication Request (non ?symptomatic) ?Reason for Call Symptomatic / Request for Health Information ?Initial Comment Caller states she has been waiting for her ?medication to be approved sine last week. ?Translation No ?Nurse Assessment ?Nurse: Rennis Harding, RN, Abigail Date/Time (Eastern Time): 08/05/2021 5:47:52 PM ?Confirm and document reason for call. If ?symptomatic, describe symptoms. ?---Caller states that she has been waiting for a ?medication to be approved for weightloss. Pt changed ?medications and went to [facility] and said that ?insurance is not approving it and sent something to the ?office. Have not heard anything since last week. No ?symptoms of anything right now. ?Does the patient have any new or worsening ?symptoms? ---No ?Please document clinical information provided and ?list any resource used. ?---Advised that message will be sent to the office and ?that they should also follow up with the office when ?they open back up. ?Disp. Time (Eastern ?Time) Disposition Final User ?08/05/2021 5:41:56 PM Send To Nurse Shawnee Knapp, RN, Luz ?08/05/2021 5:42:51 PM Send To Nurse Shawnee Knapp, RN, Luz ?08/05/2021 5:50:29 PM Clinical Call Yes Rennis Harding, RN, Abigail ?

## 2021-08-06 NOTE — Telephone Encounter (Signed)
Will call pharmacy to follow up  ?

## 2021-08-06 NOTE — Telephone Encounter (Signed)
Pt called in asking what she can do in the mean time while waiting on the PA. ? ?Please advise  ?

## 2021-08-07 IMAGING — US US PELVIS COMPLETE WITH TRANSVAGINAL
1 series · 15 of 25 positions shown · non-contrast
Comparison: None

CLINICAL DATA: Abnormal uterine bleeding, history of Caesarean
section, LMP 01/30/2020

EXAM:
TRANSABDOMINAL AND TRANSVAGINAL ULTRASOUND OF PELVIS
TECHNIQUE: Both transabdominal and transvaginal ultrasound examinations of the
pelvis were performed. Transabdominal technique was performed for
global imaging of the pelvis including uterus, ovaries, adnexal
regions, and pelvic cul-de-sac. It was necessary to proceed with
endovaginal exam following the transabdominal exam to visualize the
uterus and cervix.

[Series 1: us pelvis complete with transvaginal · 15 of 115 slices shown]
[im 1/115]
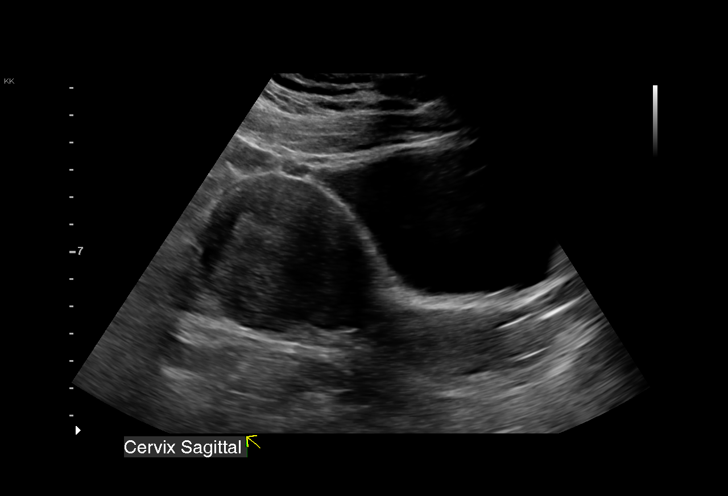
[im 10/115]
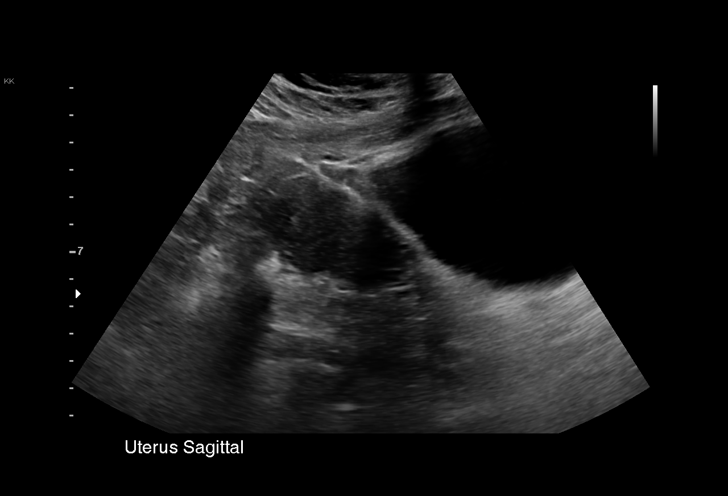
[im 20/115]
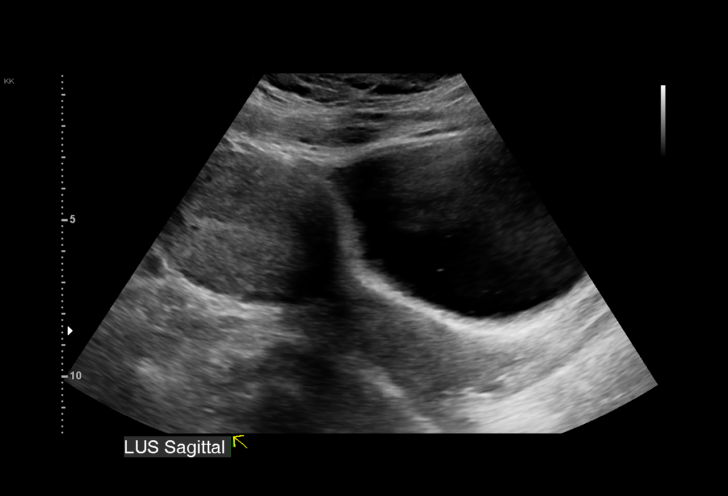
[im 24/115]
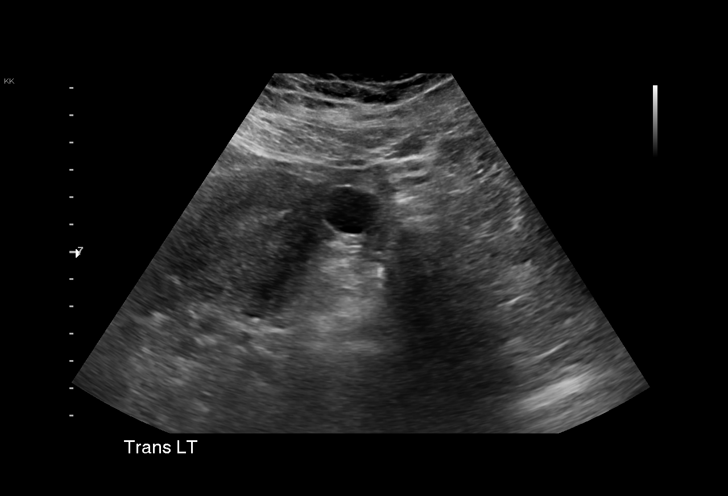
[im 34/115]
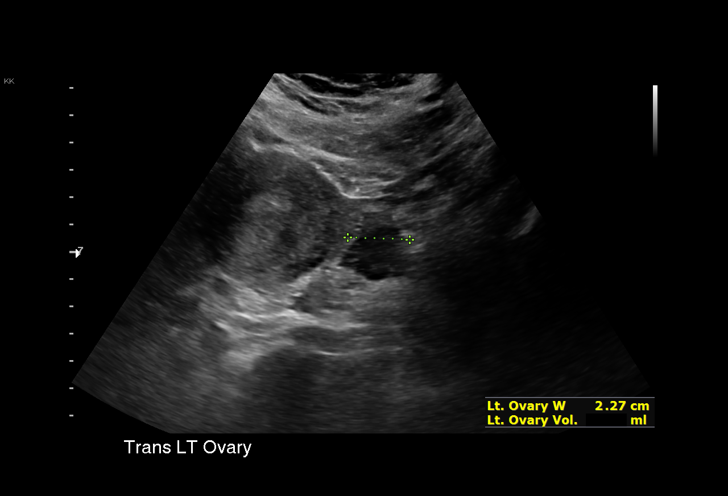
[im 43/115]
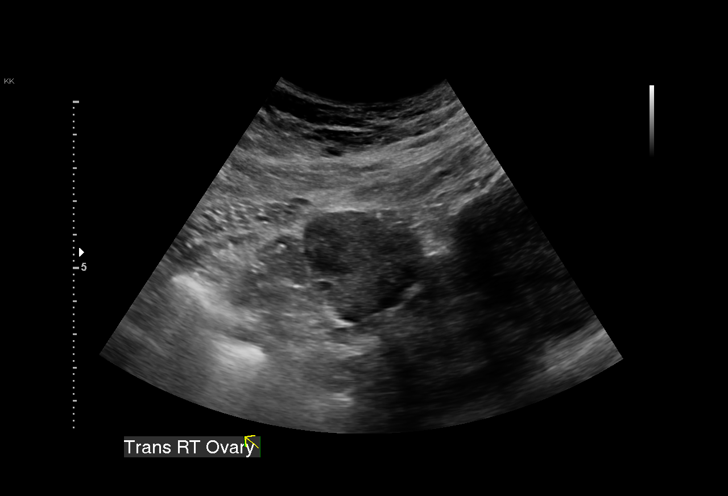
[im 48/115]
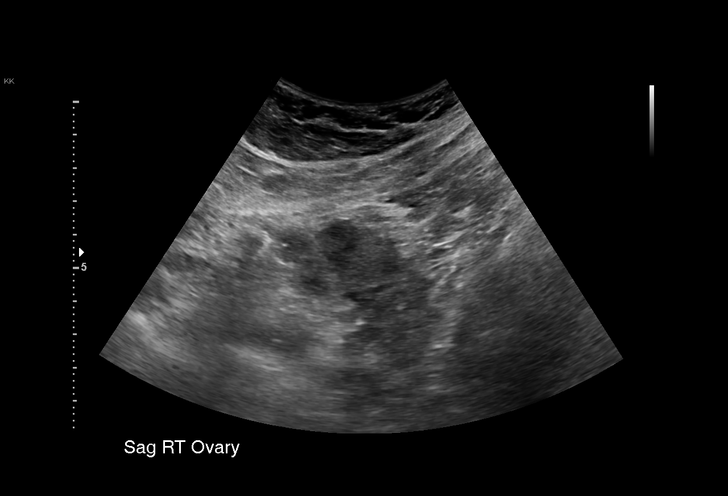
[im 58/115]
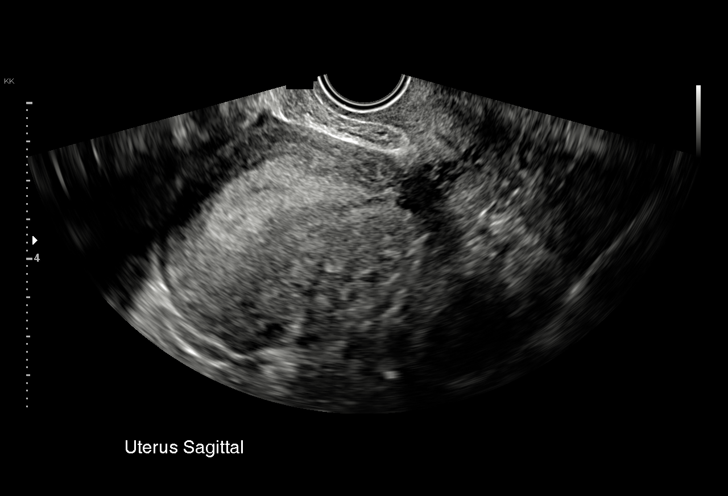
[im 67/115]
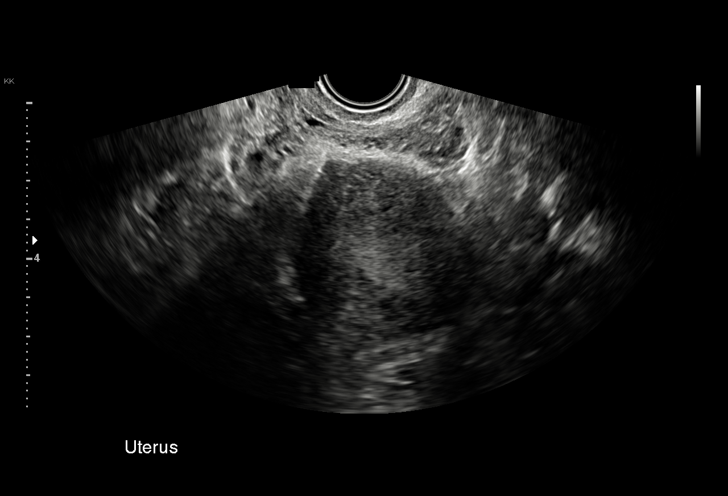
[im 72/115]
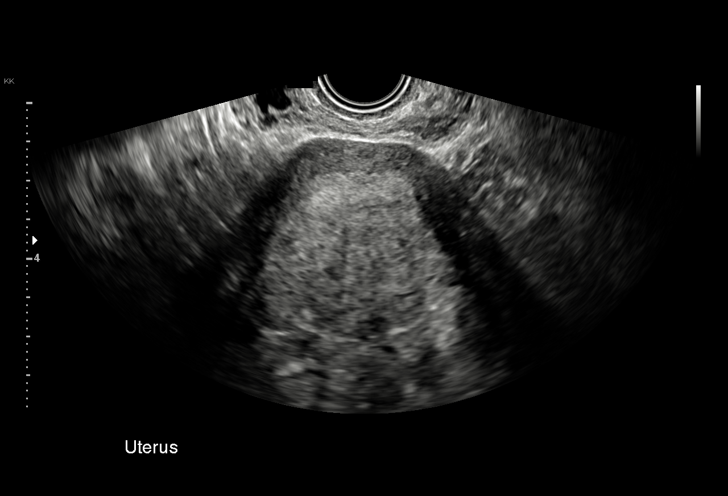
[im 81/115]
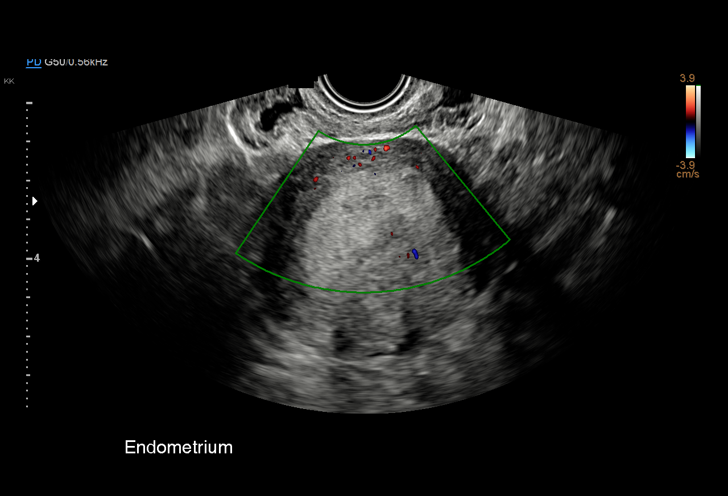
[im 91/115]
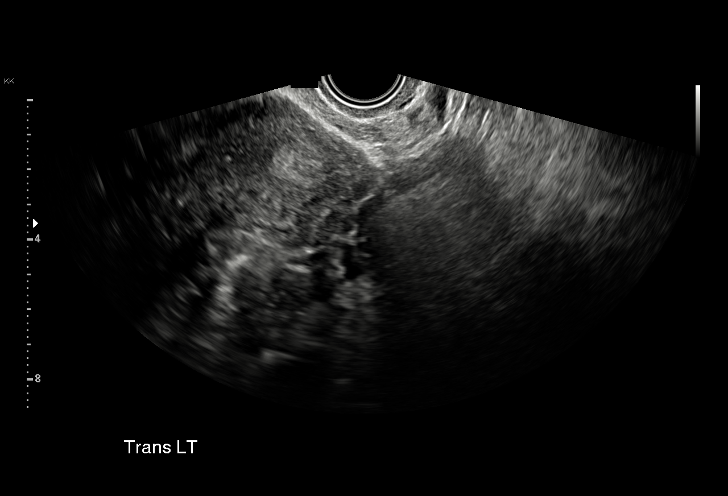
[im 96/115]
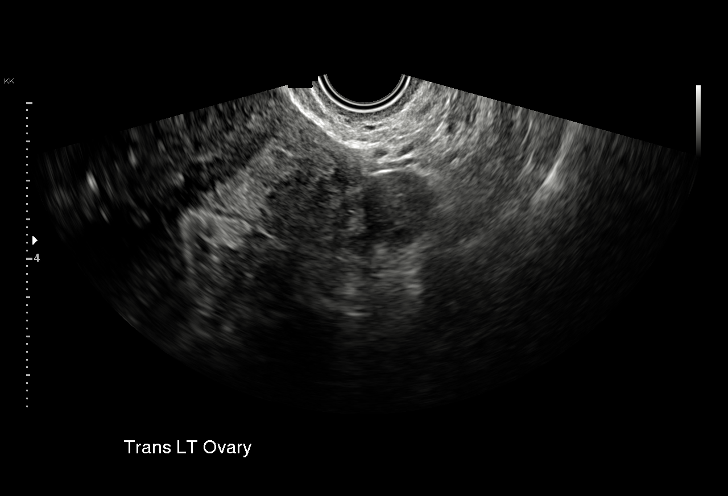
[im 105/115]
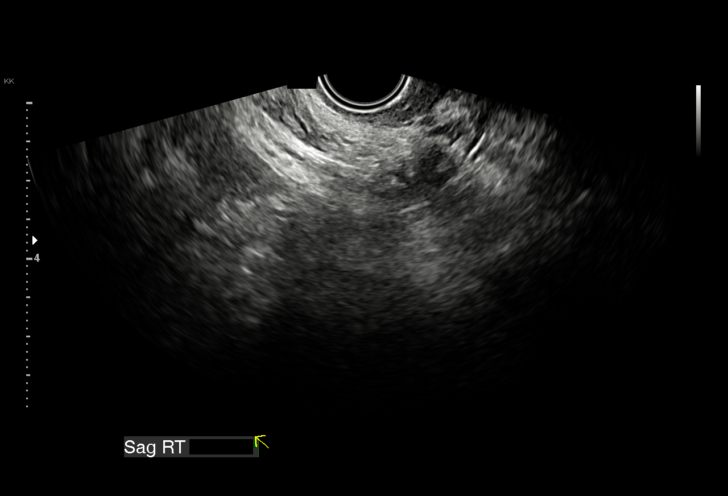
[im 115/115]
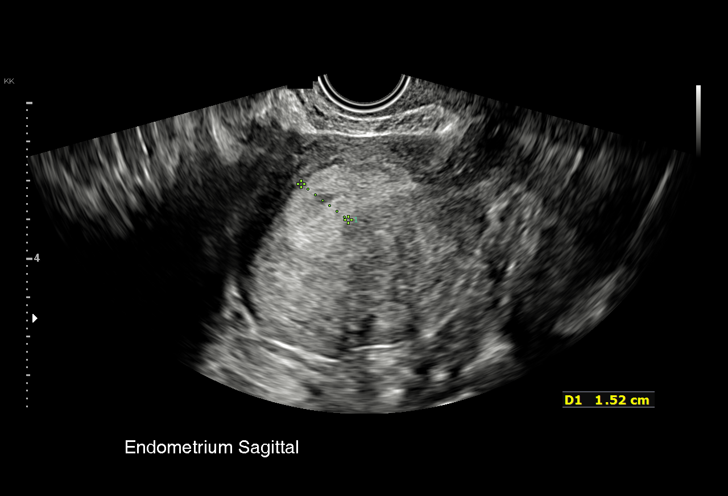

[15 of 25 positions shown; findings below may reference images not displayed]

FINDINGS: Uterus

Measurements: 10.9 x 5.6 x 5.7 cm = volume: 180 ML. Anteverted.
Asymmetric thickening of the posterior wall the upper uterus, which
is slightly heterogeneous, though no discrete mass is identified.
This could represent adenomyosis. No additional uterine
abnormalities.

Endometrium

Thickness: 16 mm.  No endometrial fluid or focal abnormality

Right ovary

Measurements: 3.1 x 2.6 x 3.4 cm = volume: 14.8 mL. Normal
morphology without mass

Left ovary

Measurements: 4.0 x 2.6 x 2.0 cm = volume: 11.4 mL. Normal
morphology without mass

Other findings

Simple cyst in LEFT adnexa adjacent to LEFT ovary question
paraovarian cyst, simple features, 1.9 cm diameter.
IMPRESSION: 1.9 cm diameter simple appearing LEFT paraovarian cyst.

Asymmetric thickening and heterogeneity of posterior wall of upper
uterus, question adenomyosis.

No other pelvic sonographic abnormalities.

## 2021-08-12 ENCOUNTER — Other Ambulatory Visit: Payer: Self-pay | Admitting: Registered Nurse

## 2021-08-12 DIAGNOSIS — I10 Essential (primary) hypertension: Secondary | ICD-10-CM

## 2021-08-13 ENCOUNTER — Telehealth: Payer: Self-pay

## 2021-08-13 ENCOUNTER — Other Ambulatory Visit: Payer: Self-pay | Admitting: Registered Nurse

## 2021-08-13 DIAGNOSIS — Z8639 Personal history of other endocrine, nutritional and metabolic disease: Secondary | ICD-10-CM

## 2021-08-13 DIAGNOSIS — E669 Obesity, unspecified: Secondary | ICD-10-CM

## 2021-08-13 DIAGNOSIS — I10 Essential (primary) hypertension: Secondary | ICD-10-CM

## 2021-08-13 MED ORDER — SEMAGLUTIDE(0.25 OR 0.5MG/DOS) 2 MG/1.5ML ~~LOC~~ SOPN
0.5000 mg | PEN_INJECTOR | SUBCUTANEOUS | 0 refills | Status: DC
Start: 2021-08-13 — End: 2021-09-16

## 2021-08-13 NOTE — Telephone Encounter (Signed)
Called patient to let her know what happened with the Prior Authorization. Patient understood the PA was Resubmitted today with the corrections  ?

## 2021-08-13 NOTE — Telephone Encounter (Signed)
Patient called and wanted to know about the PA for ozempic. I looked it up on cover my meds and it looks as though the option was checked to send in an alternative medication, Trulicity. Wanting to follow up on this as I see that wasn't sent in for patient and if this info is correct.  ?

## 2021-08-14 ENCOUNTER — Telehealth: Payer: Self-pay

## 2021-08-14 NOTE — Telephone Encounter (Signed)
Called patient to let her know that her PA for Ozempic has been approved. Patient understood. ?

## 2021-08-23 NOTE — Telephone Encounter (Signed)
Confirming that there is an update on this - you got it covered, correct? ? ?[employer]ie [employer] work! ? ?Thanks, ? ?Rich

## 2021-08-27 IMAGING — MG DIGITAL DIAGNOSTIC BILAT W/ TOMO W/ CAD
6 of 10 series · 6 of 30 positions shown · non-contrast
Comparison: Baseline exam

CLINICAL DATA: Palpable abnormality in the LEFT axilla for 2-3
years.

EXAM:
DIGITAL DIAGNOSTIC BILATERAL MAMMOGRAM WITH CAD AND TOMO
ULTRASOUND RIGHT BREAST
ULTRASOUND LEFT AXILLA

[L MLO synth-2D]
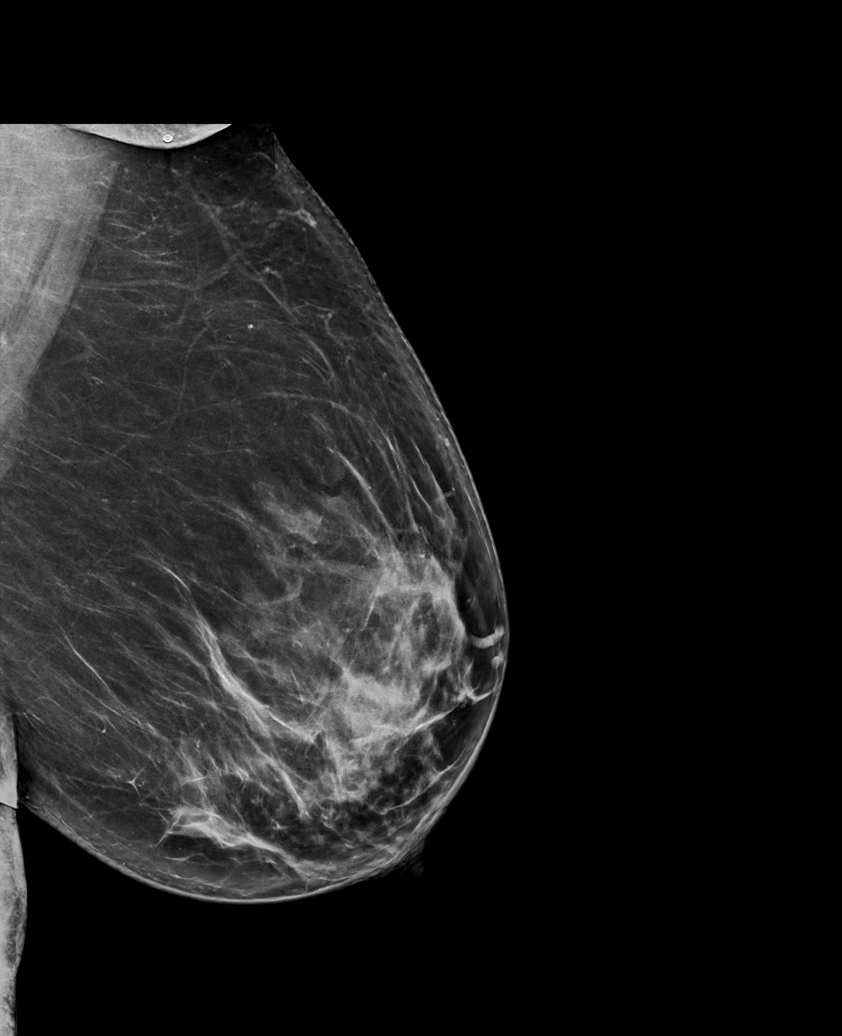

[L CC synth-2D]
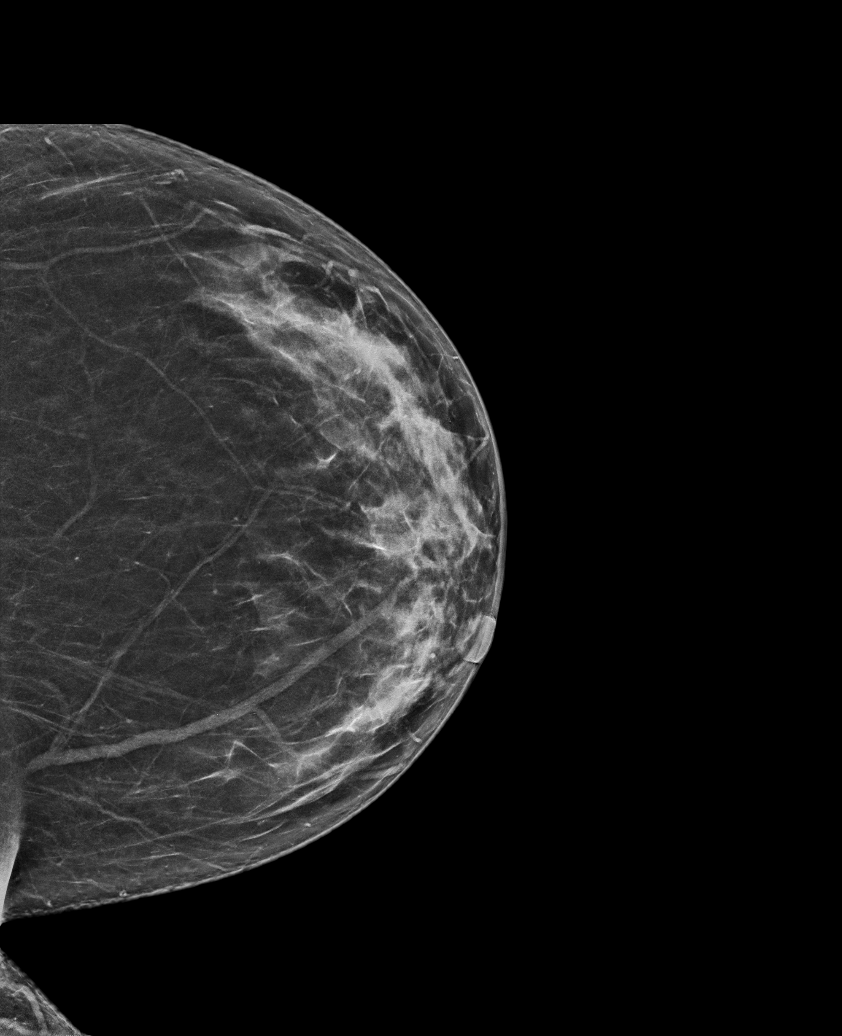

[R CC synth-2D]
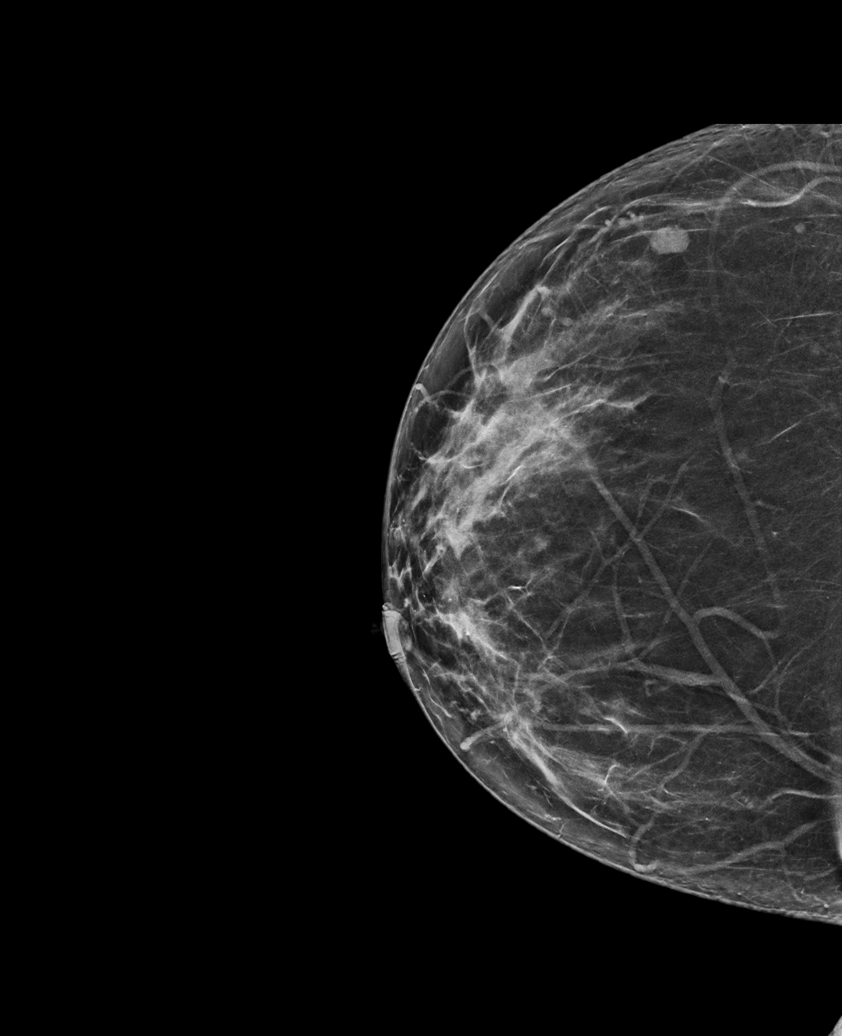

[L TAN synth-2D]
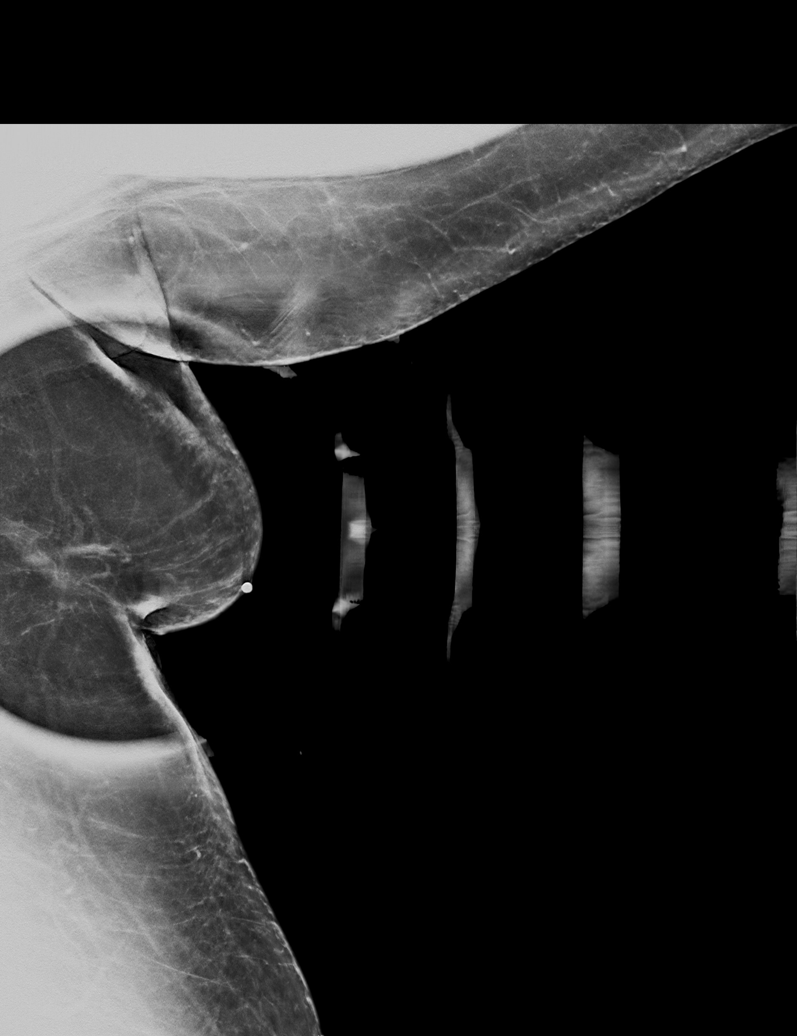

[R MLO synth-2D]
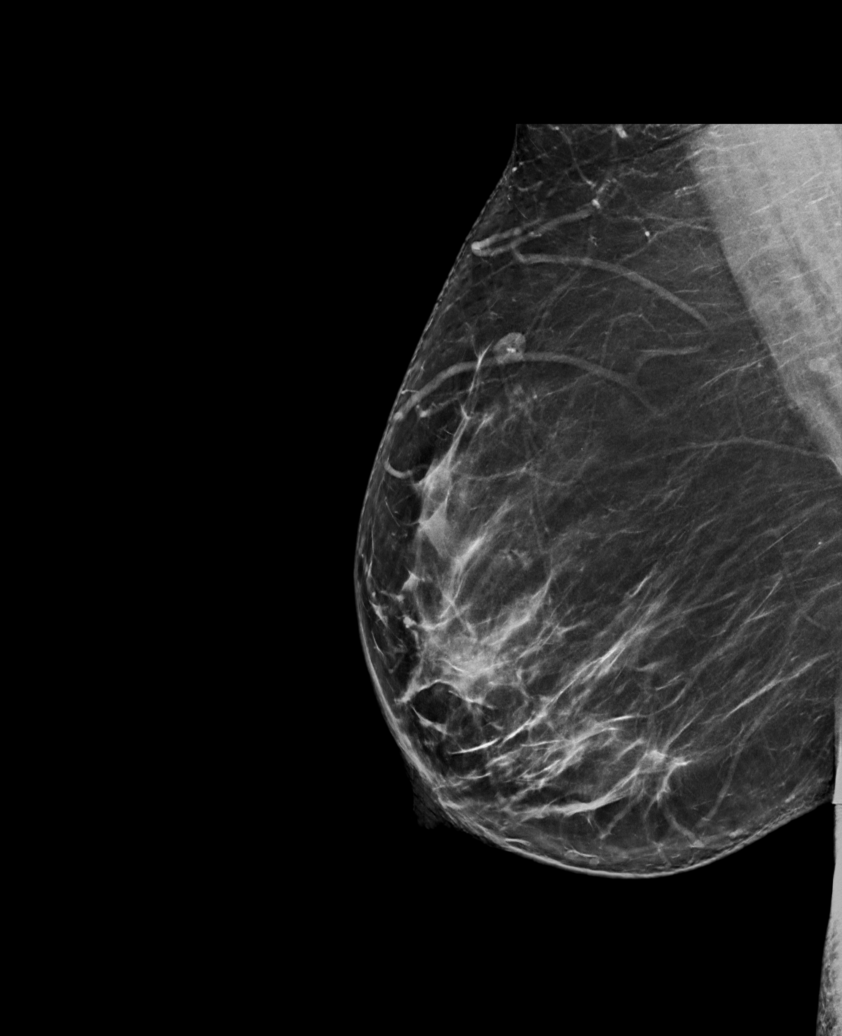

[R MLO tomo · tomo slice 43/84.0]
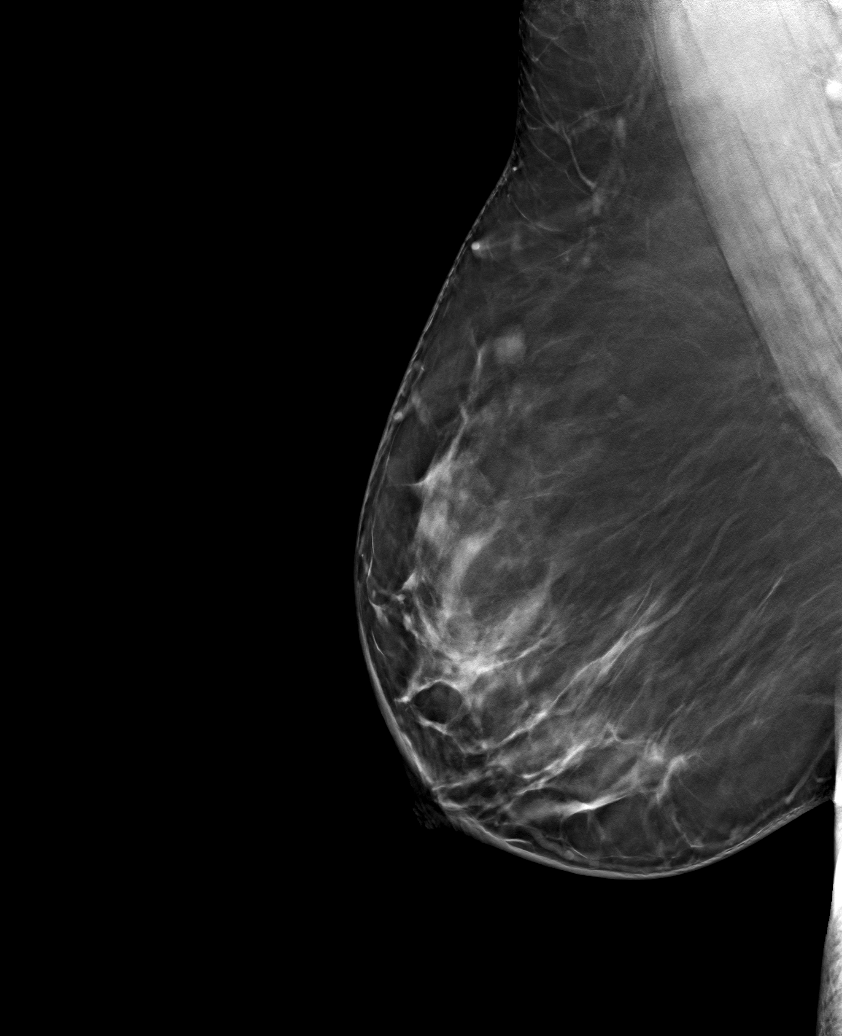

[6 of 30 positions shown; findings below may reference images not displayed]

ACR Breast Density Category c: The breast tissue is heterogeneously
dense, which may obscure small masses.
FINDINGS: RIGHT BREAST:

Mammogram: There is a circumscribed oval mass in the LOWER portion
of the RIGHT breast and further evaluated with ultrasound. RIGHT
breast is otherwise negative. Mammographic images were processed
with CAD.

Ultrasound: Targeted ultrasound is performed, showing a
circumscribed oval hypoechoic mass hyperechoic center in the 10
o'clock location of the RIGHT breast 10 centimeters from the nipple
measuring 0.8 x 0.7 centimeters. Cortex is normal, 1.3 millimeters.

LEFT BREAST:

Mammogram: There is prominent LEFT axillary fatty tissue
corresponding to the area of patient's concern. Spot tangential view
of this region shows no mass. Mammographic images were processed
with CAD.

Physical Exam: I palpate discrete soft convexity in the LEFT axilla
corresponding to the area of patient's concern. I palpate no mass in
this region.

Ultrasound: Targeted ultrasound is performed, showing normal
appearing fibrofatty tissue in the LEFT axilla corresponding to the
area of concern. Normal LEFT axillary lymph nodes are imaged.
IMPRESSION: No mammographic or ultrasound evidence for malignancy. Palpable
abnormality in the LEFT axilla is normal fatty tissue.

RECOMMENDATION:
Screening mammogram at age 40 unless there are persistent or
intervening clinical concerns. (Code:RQ-B-HSF)

I have discussed the findings and recommendations with the patient.
If applicable, a reminder letter will be sent to the patient
regarding the next appointment.

BI-RADS CATEGORY  2: Benign.

## 2021-09-16 ENCOUNTER — Telehealth: Payer: Self-pay

## 2021-09-16 DIAGNOSIS — Z8639 Personal history of other endocrine, nutritional and metabolic disease: Secondary | ICD-10-CM

## 2021-09-16 DIAGNOSIS — E669 Obesity, unspecified: Secondary | ICD-10-CM

## 2021-09-16 DIAGNOSIS — I10 Essential (primary) hypertension: Secondary | ICD-10-CM

## 2021-09-16 MED ORDER — SEMAGLUTIDE(0.25 OR 0.5MG/DOS) 2 MG/1.5ML ~~LOC~~ SOPN
0.5000 mg | PEN_INJECTOR | SUBCUTANEOUS | 0 refills | Status: DC
Start: 2021-09-16 — End: 2021-11-13

## 2021-09-16 NOTE — Telephone Encounter (Signed)
MEDICATION: Semaglutide,0.25 or 0.5MG /DOS, 2 MG/1.5ML SOPN ? ?PHARMACY: [facility], [place] ? ?Comments: Patient is completely out.  ? ?**Let patient know to contact pharmacy at the end of the day to make sure medication is ready. ** ? ?** Please notify patient to allow 48-72 hours to process** ? ?**Encourage patient to contact the pharmacy for refills or they can request refills through [facility]** ? ? ?

## 2021-09-16 NOTE — Telephone Encounter (Signed)
Sent into the pharmacy.  

## 2021-09-18 ENCOUNTER — Telehealth: Payer: Self-pay | Admitting: Registered Nurse

## 2021-09-18 NOTE — Telephone Encounter (Signed)
Patient called stating that the pharmacy told her the Ozempic was denied by insurance but there has been a PA approved on this already. I let her know that I would have a CMA call the pharmacy to see what the issue is

## 2021-09-18 NOTE — Telephone Encounter (Signed)
Called the pharmacy and they stated that she has to pay 74.99 even with insurance for 3 months. I made patient aware and she has scheduled an appointment with PCP

## 2021-09-24 ENCOUNTER — Telehealth (INDEPENDENT_AMBULATORY_CARE_PROVIDER_SITE_OTHER): Payer: Commercial Managed Care - HMO | Admitting: Registered Nurse

## 2021-09-24 ENCOUNTER — Encounter: Payer: Self-pay | Admitting: Registered Nurse

## 2021-09-24 ENCOUNTER — Other Ambulatory Visit: Payer: Self-pay

## 2021-09-24 DIAGNOSIS — E669 Obesity, unspecified: Secondary | ICD-10-CM

## 2021-09-24 NOTE — Progress Notes (Signed)
Telemedicine Encounter- SOAP NOTE Established Patient  This telephone encounter was conducted with the patient's (or proxy's) verbal consent via audio telecommunications: yes/no: Yes Patient was instructed to have this encounter in a suitably private space; and to only have persons present to whom they give permission to participate. In addition, patient identity was confirmed by use of name plus two identifiers (DOB and address).  I discussed the limitations, risks, security and privacy concerns of performing an evaluation and management service by telephone and the availability of in person appointments. I also discussed with the patient that there may be a patient responsible charge related to this service. The patient expressed understanding and agreed to proceed.  I spent a total of 14 minutes talking with the patient or their proxy.  Patient at home Provider in office  Participants: Jari Sportsman, NP and Marilynn Rail  Chief Complaint  Patient presents with  . Medication Problem    Patient states she is having some problems getting the medication so she wants to discuss surgery    Subjective   [name] is a 31 y.o. established patient. Telephone visit today for   HPI   Patient Active Problem List   Diagnosis Date Noted  . Iron deficiency anemia due to chronic menstrual blood loss 02/15/2020    Past Medical History:  Diagnosis Date  . Anemia    [employer] 02/19/2020  . Hypertension    [employer] 02/19/2020    Current Outpatient Medications  Medication Sig Dispense Refill  . amLODipine-benazepril (LOTREL) 10-20 MG capsule TAKE 1 CAPSULE BY MOUTH DAILY 90 capsule 1  . buPROPion (WELLBUTRIN XL) 300 MG 24 hr tablet Take 1 tablet (300 mg total) by mouth daily. 76 tablet 0  . ferrous sulfate (FERROUSUL) 325 (65 FE) MG tablet Take 1 tablet (325 mg total) by mouth 2 (two) times daily. 60 tablet 1  . hydrOXYzine (ATARAX) 10 MG tablet Take 1 tablet (10 mg total) by mouth 3  (three) times daily as needed. 30 tablet 0  . ibuprofen (ADVIL) 200 MG tablet Take 400 mg by mouth every 6 (six) hours as needed for mild pain.    Marland Kitchen buPROPion (WELLBUTRIN XL) 150 MG 24 hr tablet Take 1 tablet (150 mg total) by mouth daily for 14 days. 14 tablet 0  . Semaglutide,0.25 or 0.5MG /DOS, 2 MG/1.5ML SOPN Inject 0.5 mg into the skin once a week. (Patient not taking: Reported on 09/24/2021) 4.5 mL 0   No current facility-administered medications for this visit.    No Known Allergies  Social History   Socioeconomic History  . Marital status: Single    Spouse name: Not on file  . Number of children: 1  . Years of education: Not on file  . Highest education level: Not on file  Occupational History  . Not on file  Tobacco Use  . Smoking status: Never  . Smokeless tobacco: Never  Vaping Use  . Vaping Use: Never used  Substance and Sexual Activity  . Alcohol use: Yes  . Drug use: No  . Sexual activity: Yes    Birth control/protection: None  Other Topics Concern  . Not on file  Social History Narrative   Marital status: engaged; moved to [place] from [place] in 2000.        Children;  One child (9yo)      Lives:  With husband, son.      Employment:  [employer], studies sociology      Tobacco: none  Alcohol: wine on weekends      Drugs:  none   Social Determinants of Health   Financial Resource Strain: Not on file  Food Insecurity: Not on file  Transportation Needs: Not on file  Physical Activity: Not on file  Stress: Not on file  Social Connections: Not on file  Intimate Partner Violence: Not on file    ROS  Objective   Vitals as reported by the patient: There were no vitals filed for this visit.  There are no diagnoses linked to this encounter.   I discussed the assessment and treatment plan with the patient. The patient was provided an opportunity to ask questions and all were answered. The patient agreed with the plan and demonstrated an  understanding of the instructions.   The patient was advised to call back or seek an in-person evaluation if the symptoms worsen or if the condition fails to improve as anticipated.  I provided *** minutes of non-face-to-face time during this encounter.  Janeece Agee, NP  Primary Care at [facility]

## 2021-09-24 NOTE — Patient Instructions (Signed)
° ° ° °  If you have lab work done today you will be contacted with your lab results within the next 2 weeks.  If you have not heard from us then please contact us. The fastest way to get your results is to register for My Chart. ° ° °IF you received an x-ray today, you will receive an invoice from [facility]. Please contact [facility] at [phone] with questions or concerns regarding your invoice.  ° °IF you received labwork today, you will receive an invoice from [facility]. Please contact [facility] at [phone] with questions or concerns regarding your invoice.  ° °Our billing staff will not be able to assist you with questions regarding bills from these companies. ° °You will be contacted with the lab results as soon as they are available. The fastest way to get your results is to activate your My Chart account. Instructions are located on the last page of this paperwork. If you have not heard from us regarding the results in 2 weeks, please contact this office. °  ° ° ° °

## 2021-10-29 ENCOUNTER — Telehealth: Payer: Self-pay | Admitting: Registered Nurse

## 2021-10-29 NOTE — Telephone Encounter (Signed)
We can get her in to see dieticians or medical weight management, but otherwise, we are out of options.  Thank you,  Rich

## 2021-10-29 NOTE — Telephone Encounter (Signed)
PT called stating that Dr. Mancel Bale told her that her BMI isn't high enough for her weight lose surgery. Pt want to know what should she do next. Pt also stated that [facility] don't take her insurance, that's why pt had to go to [facility].

## 2021-11-11 ENCOUNTER — Telehealth: Payer: Self-pay | Admitting: Registered Nurse

## 2021-11-11 NOTE — Telephone Encounter (Signed)
Encourage patient to contact the pharmacy for refills or they can request refills through [facility]  (Please schedule appointment if patient has not been seen in over a year)    WHAT PHARMACY WOULD THEY LIKE THIS SENT TO: [facility] [phone]  MEDICATION NAME & DOSE: Pt states that ozempic is not covered by insurance. Pt was was advised that trulicity is covered. May she have that rx called in instead?  NOTES/COMMENTS FROM PATIENT: [phone]      Front office please notify patient: It takes 48-72 hours to process rx refill requests Ask patient to call pharmacy to ensure rx is ready before heading there.

## 2021-11-13 ENCOUNTER — Telehealth: Payer: Self-pay

## 2021-11-13 ENCOUNTER — Other Ambulatory Visit: Payer: Self-pay

## 2021-11-13 DIAGNOSIS — E669 Obesity, unspecified: Secondary | ICD-10-CM

## 2021-11-13 DIAGNOSIS — Z8639 Personal history of other endocrine, nutritional and metabolic disease: Secondary | ICD-10-CM

## 2021-11-13 DIAGNOSIS — I10 Essential (primary) hypertension: Secondary | ICD-10-CM

## 2021-11-13 MED ORDER — SEMAGLUTIDE(0.25 OR 0.5MG/DOS) 2 MG/1.5ML ~~LOC~~ SOPN: 0.5000 mg | PEN_INJECTOR | SUBCUTANEOUS | 0 refills | Status: DC

## 2021-11-13 NOTE — Telephone Encounter (Signed)
Trulicity covered under dx of T2dm, she is not even prediabetic which means We cannot get this covered  Thanks,  Retail banker

## 2021-11-13 NOTE — Telephone Encounter (Signed)
Pt states that ozempic is not covered by insurance. Pt was was advised that trulicity is covered. May she have that rx called in instead?

## 2021-11-20 ENCOUNTER — Telehealth (INDEPENDENT_AMBULATORY_CARE_PROVIDER_SITE_OTHER): Payer: Commercial Managed Care - HMO | Admitting: Registered Nurse

## 2021-11-20 DIAGNOSIS — N921 Excessive and frequent menstruation with irregular cycle: Secondary | ICD-10-CM | POA: Diagnosis not present

## 2021-11-20 DIAGNOSIS — F418 Other specified anxiety disorders: Secondary | ICD-10-CM

## 2021-11-20 MED ORDER — DESOGESTREL-ETHINYL ESTRADIOL 0.15-30 MG-MCG PO TABS: 1.0000 | ORAL_TABLET | Freq: Every day | ORAL | 3 refills | Status: DC

## 2021-11-20 MED ORDER — TRAZODONE HCL 50 MG PO TABS: 25.0000 mg | ORAL_TABLET | Freq: Every evening | ORAL | 3 refills | Status: AC | PRN

## 2021-11-20 NOTE — Patient Instructions (Signed)
[name] -   [name] to talk to you!

## 2021-11-20 NOTE — Progress Notes (Signed)
Telemedicine Encounter- SOAP NOTE Established Patient  This telephone encounter was conducted with the patient's (or proxy's) verbal consent via audio telecommunications: yes/no: Yes Patient was instructed to have this encounter in a suitably private space; and to only have persons present to whom they give permission to participate. In addition, patient identity was confirmed by use of name plus two identifiers (DOB and address).  I discussed the limitations, risks, security and privacy concerns of performing an evaluation and management service by telephone and the availability of in person appointments. I also discussed with the patient that there may be a patient responsible charge related to this service. The patient expressed understanding and agreed to proceed.  I spent a total of  talking with the patient or their proxy.  Patient at home Provider in office  Participants: Jari Sportsman, NP and Marilynn Rail  No chief complaint on file.   Subjective   [name] is a 31 y.o. established patient. Telephone visit today for abnormal menses.  HPI Onset 3 weeks ago with light menses for 2 days, then became very very heavy. A lot of clots.  She does note symptoms of anemia with lightheadedness, dizziness.  She did have an episode of LOC at work last Friday. Passed out at work upon rising quickly and was roused easily.  She is having severe cramping. Has been taking advil for this.   Hx of anemia r/t heavy menses.  She does plan on seeking pregnancy in the future. She is certain that she is not pregnant at this time.  Patient Active Problem List   Diagnosis Date Noted   Iron deficiency anemia due to chronic menstrual blood loss 02/15/2020    Past Medical History:  Diagnosis Date   Anemia    Phreesia 02/19/2020   Hypertension    [employer]reesia 02/19/2020    Current Outpatient Medications  Medication Sig Dispense Refill   desogestrel-ethinyl estradiol (APRI) 0.15-30 MG-MCG  tablet Take 1 tablet by mouth daily. 84 tablet 3   traZODone (DESYREL) 50 MG tablet Take 0.5-1 tablets (25-50 mg total) by mouth at bedtime as needed for sleep. 30 tablet 3   amLODipine-benazepril (LOTREL) 10-20 MG capsule TAKE 1 CAPSULE BY MOUTH DAILY 90 capsule 1   buPROPion (WELLBUTRIN XL) 150 MG 24 hr tablet Take 1 tablet (150 mg total) by mouth daily for 14 days. 14 tablet 0   buPROPion (WELLBUTRIN XL) 300 MG 24 hr tablet Take 1 tablet (300 mg total) by mouth daily. 76 tablet 0   ferrous sulfate (FERROUSUL) 325 (65 FE) MG tablet Take 1 tablet (325 mg total) by mouth 2 (two) times daily. 60 tablet 1   hydrOXYzine (ATARAX) 10 MG tablet Take 1 tablet (10 mg total) by mouth 3 (three) times daily as needed. 30 tablet 0   ibuprofen (ADVIL) 200 MG tablet Take 400 mg by mouth every 6 (six) hours as needed for mild pain.     Semaglutide,0.25 or 0.5MG /DOS, 2 MG/1.5ML SOPN Inject 0.5 mg into the skin once a week. 4.5 mL 0   No current facility-administered medications for this visit.    No Known Allergies  Social History   Socioeconomic History   Marital status: Single    Spouse name: Not on file   Number of children: 1   Years of education: Not on file   Highest education level: Not on file  Occupational History   Not on file  Tobacco Use   Smoking status: Never   Smokeless tobacco:  Never  Vaping Use   Vaping Use: Never used  Substance and Sexual Activity   Alcohol use: Yes   Drug use: No   Sexual activity: Yes    Birth control/protection: None  Other Topics Concern   Not on file  Social History Narrative   Marital status: engaged; moved to [place] from [place] in 2000.        Children;  One child (9yo)      Lives:  With husband, son.      Employment:  [employer], studies sociology      Tobacco: none       Alcohol: wine on weekends      Drugs:  none   Social Determinants of Corporate investment banker Strain: Not on file  Food Insecurity: No Food Insecurity (02/14/2020)    Hunger Vital Sign    Worried About Running Out of Food in the Last Year: Never true    Ran Out of Food in the Last Year: Never true  Transportation Needs: No Transportation Needs (02/14/2020)   PRAPARE - Administrator, Civil Service (Medical): No    Lack of Transportation (Non-Medical): No  Physical Activity: Not on file  Stress: Not on file  Social Connections: Not on file  Intimate Partner Violence: Not on file    ROS Per hpi   Objective   Vitals as reported by the patient: There were no vitals filed for this visit.  Diagnoses and all orders for this visit:  Anxiety with depression -     traZODone (DESYREL) 50 MG tablet; Take 0.5-1 tablets (25-50 mg total) by mouth at bedtime as needed for sleep.  Menorrhagia with irregular cycle -     desogestrel-ethinyl estradiol (APRI) 0.15-30 MG-MCG tablet; Take 1 tablet by mouth daily. -     Cancel: CBC with Differential/Platelet -     Cancel: TSH -     Cancel: Iron, TIBC and Ferritin Panel -     Cancel: US Pelvis Complete; Future -     CBC with Differential/Platelet; Future -     TSH; Future -     IBC + Ferritin; Future -     US PELVIC COMPLETE WITH TRANSVAGINAL; Future    PLAN She will get labs today. I will order pelvic US - hx of cysts. She has hx of endometriosis as well Will start her on COCs to help control bleeding. Iron supplementation per lab indications Low threshold for ER visit. Given her symptomatic status, she may need transfusion / iron infusion. She voices understanding of this.  Patient encouraged to call clinic with any questions, comments, or concerns.  I discussed the assessment and treatment plan with the patient. The patient was provided an opportunity to ask questions and all were answered. The patient agreed with the plan and demonstrated an understanding of the instructions.   The patient was advised to call back or seek an in-person evaluation if the symptoms worsen or if the condition  fails to improve as anticipated.  I provided 15 minutes of non-face-to-face time during this encounter.  Janeece Agee, NP

## 2021-11-21 ENCOUNTER — Ambulatory Visit
Admission: RE | Admit: 2021-11-21 | Discharge: 2021-11-21 | Disposition: A | Discharge status: Home / Self Care | Payer: Commercial Managed Care - HMO | Source: Ambulatory Visit | Attending: Registered Nurse | Admitting: Registered Nurse

## 2021-11-21 DIAGNOSIS — N921 Excessive and frequent menstruation with irregular cycle: Secondary | ICD-10-CM

## 2021-12-11 ENCOUNTER — Other Ambulatory Visit: Payer: Self-pay

## 2021-12-11 ENCOUNTER — Telehealth: Payer: Self-pay | Admitting: Registered Nurse

## 2021-12-11 DIAGNOSIS — I10 Essential (primary) hypertension: Secondary | ICD-10-CM

## 2021-12-11 DIAGNOSIS — Z8639 Personal history of other endocrine, nutritional and metabolic disease: Secondary | ICD-10-CM

## 2021-12-11 DIAGNOSIS — E669 Obesity, unspecified: Secondary | ICD-10-CM

## 2021-12-11 MED ORDER — SEMAGLUTIDE(0.25 OR 0.5MG/DOS) 2 MG/1.5ML ~~LOC~~ SOPN: 0.5000 mg | PEN_INJECTOR | SUBCUTANEOUS | 0 refills | Status: DC

## 2021-12-11 NOTE — Telephone Encounter (Signed)
Encourage patient to contact the pharmacy for refills or they can request refills through [facility]  (Please schedule appointment if patient has not been seen in over a year)    WHAT PHARMACY WOULD THEY LIKE THIS SENT TO: [facility] [phone]  MEDICATION NAME & DOSE: semaglutide 0.25 mg  NOTES/COMMENTS FROM PATIENT:      Front office please notify patient: It takes 48-72 hours to process rx refill requests Ask patient to call pharmacy to ensure rx is ready before heading there.

## 2022-01-15 ENCOUNTER — Encounter: Payer: Self-pay | Admitting: Nurse Practitioner

## 2022-01-15 ENCOUNTER — Ambulatory Visit (INDEPENDENT_AMBULATORY_CARE_PROVIDER_SITE_OTHER): Payer: Commercial Managed Care - HMO | Admitting: Nurse Practitioner

## 2022-01-15 VITALS — BP 160/98 | HR 89 | Temp 97.3°F | Ht 63.0 in | Wt 200.6 lb

## 2022-01-15 DIAGNOSIS — N921 Excessive and frequent menstruation with irregular cycle: Secondary | ICD-10-CM | POA: Diagnosis not present

## 2022-01-15 DIAGNOSIS — D500 Iron deficiency anemia secondary to blood loss (chronic): Secondary | ICD-10-CM

## 2022-01-15 DIAGNOSIS — E559 Vitamin D deficiency, unspecified: Secondary | ICD-10-CM | POA: Diagnosis not present

## 2022-01-15 DIAGNOSIS — E669 Obesity, unspecified: Secondary | ICD-10-CM | POA: Insufficient documentation

## 2022-01-15 DIAGNOSIS — R739 Hyperglycemia, unspecified: Secondary | ICD-10-CM | POA: Diagnosis not present

## 2022-01-15 DIAGNOSIS — I10 Essential (primary) hypertension: Secondary | ICD-10-CM

## 2022-01-15 DIAGNOSIS — E66812 Obesity, class 2: Secondary | ICD-10-CM

## 2022-01-15 DIAGNOSIS — Z6835 Body mass index (BMI) 35.0-35.9, adult: Secondary | ICD-10-CM

## 2022-01-15 LAB — POCT URINE PREGNANCY: Preg Test, Ur: NEGATIVE

## 2022-01-15 MED ORDER — WEGOVY 0.5 MG/0.5ML ~~LOC~~ SOAJ: 0.5000 mg | SUBCUTANEOUS | 0 refills | Status: DC

## 2022-01-15 NOTE — Progress Notes (Signed)
Established Patient Visit  Patient: [name]   DOB: March 04, 1991   31 y.o. Female  MRN: [id number] Visit Date: 01/15/2022  Subjective:    Chief Complaint  Patient presents with   Establish Care    TOC Weight loss options, feels like ozempic isn't working BP , feels like BP meds haven't been working either    HPI Primary hypertension Uncontrolled. Did not take medication today BP Readings from Last 3 Encounters:  01/15/22 (!) 160/98  01/21/21 (!) 158/92  09/05/20 139/83   Advised about need for DASH diet, exercise, and use of contraception due to med contraindicated with pregnancy. Maintain med dose Monitor BP at home F/up in 54month  Iron deficiency anemia due to chronic menstrual blood loss She stopped COC 2weeks ago due to improved vaginal bleeding Pelvic US 11/2021: There is inhomogeneous echogenicity in myometrium without focal abnormalities. There is no abnormal increased vascularity in the endometrium.1.4 cm cystic structure in the margin of the left ovary has not changed significantly in comparison with the study of 02/27/2020, possibly a small paraovarian cyst.  Repeat cbc and iron, check urine pregnancy Consider use of micronor due to uncontrolled HTN Entered referral to GYN  Obesity Previous use of phentermine, no weight loss Use of ozempic 0.5mg  weekly x 35months, no weight loss noted. Has not made any lifestyle modifications BP Readings from Last 3 Encounters:  01/15/22 (!) 160/98  01/21/21 (!) 158/92  09/05/20 139/83   Not a candidate for phentermine due to uncontrolled BP, not candidate for contrave due to current wellbutrin rx for depression. Advised about the importance of lifestyle modification in conjunction with weight loss medication. Advised about possible GLP-1 side effects and contraindication with pregnancy. She agreed to use condoms and referral to nutritionist Switch ozempic to wegovy 0.5mg . Check CMP, TSH, hgbA1c F/up in  52month  Reviewed medical, surgical, and social history today  Medications: Outpatient Medications Prior to Visit  Medication Sig   amLODipine-benazepril (LOTREL) 10-20 MG capsule TAKE 1 CAPSULE BY MOUTH DAILY   buPROPion (WELLBUTRIN XL) 300 MG 24 hr tablet Take 1 tablet (300 mg total) by mouth daily.   ferrous sulfate (FERROUSUL) 325 (65 FE) MG tablet Take 1 tablet (325 mg total) by mouth 2 (two) times daily.   ibuprofen (ADVIL) 200 MG tablet Take 400 mg by mouth every 6 (six) hours as needed for mild pain.   traZODone (DESYREL) 50 MG tablet Take 0.5-1 tablets (25-50 mg total) by mouth at bedtime as needed for sleep.   [DISCONTINUED] Semaglutide,0.25 or 0.5MG /DOS, 2 MG/1.5ML SOPN Inject 0.5 mg into the skin once a week.   buPROPion (WELLBUTRIN XL) 150 MG 24 hr tablet Take 1 tablet (150 mg total) by mouth daily for 14 days.   [DISCONTINUED] desogestrel-ethinyl estradiol (APRI) 0.15-30 MG-MCG tablet Take 1 tablet by mouth daily. (Patient not taking: Reported on 01/15/2022)   [DISCONTINUED] hydrOXYzine (ATARAX) 10 MG tablet Take 1 tablet (10 mg total) by mouth 3 (three) times daily as needed. (Patient not taking: Reported on 01/15/2022)   No facility-administered medications prior to visit.   Reviewed past medical and social history.   ROS per HPI above  Last CBC Lab Results  Component Value Date   WBC 15.8 (H) 08/02/2020   HGB 12.7 08/02/2020   HCT 39.8 08/02/2020   MCV 78.7 (L) 08/02/2020   MCH 25.1 (L) 08/02/2020   RDW 15.3 08/02/2020   PLT 276 08/02/2020  Last metabolic panel Lab Results  Component Value Date   GLUCOSE 99 08/02/2020   NA 137 08/02/2020   K 3.1 (L) 08/02/2020   CL 102 08/02/2020   CO2 21 (L) 08/02/2020   BUN 12 08/02/2020   CREATININE 0.71 08/02/2020   GFRNONAA >60 08/02/2020   CALCIUM 9.5 08/02/2020   PROT 7.4 02/20/2020   ALBUMIN 4.2 02/20/2020   LABGLOB 3.2 02/20/2020   AGRATIO 1.3 02/20/2020   BILITOT 0.3 02/20/2020   ALKPHOS 66 02/20/2020   AST  12 02/20/2020   ALT 12 02/20/2020   ANIONGAP 14 08/02/2020   Last lipids Lab Results  Component Value Date   CHOL 126 02/20/2020   HDL 40 02/20/2020   LDLCALC 62 02/20/2020   TRIG 133 02/20/2020   CHOLHDL 3.2 02/20/2020   Last hemoglobin A1c Lab Results  Component Value Date   HGBA1C 5.6 05/15/2020   Last thyroid functions Lab Results  Component Value Date   TSH 1.380 02/20/2020   Last vitamin D No results found for: "25OHVITD2", "25OHVITD3", "VD25OH"      Objective:  BP (!) 160/98   Pulse 89   Temp (!) 97.3 F (36.3 C) (Temporal)   Ht 5\' 3"  (1.6 m)   Wt 200 lb 9.6 oz (91 kg)   SpO2 98%   BMI 35.53 kg/m      Physical Exam Constitutional:      Appearance: She is obese.  Cardiovascular:     Rate and Rhythm: Normal rate and regular rhythm.     Pulses: Normal pulses.     Heart sounds: Normal heart sounds.  Pulmonary:     Effort: Pulmonary effort is normal.     Breath sounds: Normal breath sounds.  Neurological:     Mental Status: She is alert and oriented to person, place, and time.     Results for orders placed or performed in visit on 01/15/22  POCT urine pregnancy  Result Value Ref Range   Preg Test, Ur Negative Negative      Assessment & Plan:    Problem List Items Addressed This Visit       Cardiovascular and Mediastinum   Primary hypertension - Primary    Uncontrolled. Did not take medication today BP Readings from Last 3 Encounters:  01/15/22 (!) 160/98  01/21/21 (!) 158/92  09/05/20 139/83   Advised about need for DASH diet, exercise, and use of contraception due to med contraindicated with pregnancy. Maintain med dose Monitor BP at home F/up in 26month      Relevant Orders   Basic metabolic panel   TSH     Other   Hyperglycemia   Relevant Orders   Referral to Nutrition and Diabetes Services   Hemoglobin A1c   Iron deficiency anemia due to chronic menstrual blood loss    She stopped COC 2weeks ago due to improved vaginal  bleeding Pelvic 2month 11/2021: There is inhomogeneous echogenicity in myometrium without focal abnormalities. There is no abnormal increased vascularity in the endometrium.1.4 cm cystic structure in the margin of the left ovary has not changed significantly in comparison with the study of 02/27/2020, possibly a small paraovarian cyst.  Repeat cbc and iron, check urine pregnancy Consider use of micronor due to uncontrolled HTN Entered referral to GYN      Relevant Orders   Ambulatory referral to Gynecology   CBC with Differential/Platelet   Iron, TIBC and Ferritin Panel   Menorrhagia with irregular cycle   Relevant Orders   Ambulatory  referral to Gynecology   POCT urine pregnancy (Completed)   Obesity    Previous use of phentermine, no weight loss Use of ozempic 0.5mg  weekly x 53months, no weight loss noted. Has not made any lifestyle modifications BP Readings from Last 3 Encounters:  01/15/22 (!) 160/98  01/21/21 (!) 158/92  09/05/20 139/83   Not a candidate for phentermine due to uncontrolled BP, not candidate for contrave due to current wellbutrin rx for depression. Advised about the importance of lifestyle modification in conjunction with weight loss medication. Advised about possible GLP-1 side effects and contraindication with pregnancy. She agreed to use condoms and referral to nutritionist Switch ozempic to wegovy 0.5mg . Check CMP, TSH, hgbA1c F/up in 63month       Relevant Medications   Semaglutide-Weight Management (WEGOVY) 0.5 MG/0.5ML SOAJ   Other Relevant Orders   Referral to Nutrition and Diabetes Services   Other Visit Diagnoses     Vitamin D insufficiency       Relevant Orders   Vitamin D (25 hydroxy)      Return in about 1 week (around 01/22/2022) for htn.     Alysia Penna, NP

## 2022-01-15 NOTE — Assessment & Plan Note (Signed)
Uncontrolled. Did not take medication today BP Readings from Last 3 Encounters:  01/15/22 (!) 160/98  01/21/21 (!) 158/92  09/05/20 139/83   Advised about need for DASH diet, exercise, and use of contraception due to med contraindicated with pregnancy. Maintain med dose Monitor BP at home F/up in 41month

## 2022-01-15 NOTE — Patient Instructions (Signed)
Send list of you rmedications via mychart. Check BP daily Stop ozempic Start wegovy 0.5mg  weekly Maintain low sodium diet-DASH and daily exercise (walking 20-68mins). Go to lab  DASH Eating Plan DASH stands for Dietary Approaches to Stop Hypertension. The DASH eating plan is a healthy eating plan that has been shown to: Reduce high blood pressure (hypertension). Reduce your risk for type 2 diabetes, heart disease, and stroke. Help with weight loss. What are tips for following this plan? Reading food labels Check food labels for the amount of salt (sodium) per serving. Choose foods with less than 5 percent of the Daily Value of sodium. Generally, foods with less than 300 milligrams (mg) of sodium per serving fit into this eating plan. To find whole grains, look for the word "whole" as the first word in the ingredient list. Shopping Buy products labeled as "low-sodium" or "no salt added." Buy fresh foods. Avoid canned foods and pre-made or frozen meals. Cooking Avoid adding salt when cooking. Use salt-free seasonings or herbs instead of table salt or sea salt. Check with your health care provider or pharmacist before using salt substitutes. Do not fry foods. Cook foods using healthy methods such as baking, boiling, grilling, roasting, and broiling instead. Cook with heart-healthy oils, such as olive, canola, avocado, soybean, or sunflower oil. Meal planning  Eat a balanced diet that includes: 4 or more servings of fruits and 4 or more servings of vegetables each day. Try to fill one-half of your plate with fruits and vegetables. 6-8 servings of whole grains each day. Less than 6 oz (170 g) of lean meat, poultry, or fish each day. A 3-oz (85-g) serving of meat is about the same size as a deck of cards. One egg equals 1 oz (28 g). 2-3 servings of low-fat dairy each day. One serving is 1 cup (237 mL). 1 serving of nuts, seeds, or beans 5 times each week. 2-3 servings of heart-healthy fats.  Healthy fats called omega-3 fatty acids are found in foods such as walnuts, flaxseeds, fortified milks, and eggs. These fats are also found in cold-water fish, such as sardines, salmon, and mackerel. Limit how much you eat of: Canned or prepackaged foods. Food that is high in trans fat, such as some fried foods. Food that is high in saturated fat, such as fatty meat. Desserts and other sweets, sugary drinks, and other foods with added sugar. Full-fat dairy products. Do not salt foods before eating. Do not eat more than 4 egg yolks a week. Try to eat at least 2 vegetarian meals a week. Eat more home-cooked food and less restaurant, buffet, and fast food. Lifestyle When eating at a restaurant, ask that your food be prepared with less salt or no salt, if possible. If you drink alcohol: Limit how much you use to: 0-1 drink a day for women who are not pregnant. 0-2 drinks a day for men. Be aware of how much alcohol is in your drink. In the [place] one drink equals one 12 oz bottle of beer (355 mL), one 5 oz glass of wine (148 mL), or one 1 oz glass of hard liquor (44 mL). General information Avoid eating more than 2,300 mg of salt a day. If you have hypertension, you may need to reduce your sodium intake to 1,500 mg a day. Work with your health care provider to maintain a healthy body weight or to lose weight. Ask what an ideal weight is for you. Get at least 30 minutes of exercise that  causes your heart to beat faster (aerobic exercise) most days of the week. Activities may include walking, swimming, or biking. Work with your health care provider or dietitian to adjust your eating plan to your individual calorie needs. What foods should I eat? Fruits All fresh, dried, or frozen fruit. Canned fruit in natural juice (without added sugar). Vegetables Fresh or frozen vegetables (raw, steamed, roasted, or grilled). Low-sodium or reduced-sodium tomato and vegetable juice. Low-sodium or reduced-sodium  tomato sauce and tomato paste. Low-sodium or reduced-sodium canned vegetables. Grains Whole-grain or whole-wheat bread. Whole-grain or whole-wheat pasta. Brown rice. Orpah Cobb. Bulgur. Whole-grain and low-sodium cereals. Pita bread. Low-fat, low-sodium crackers. Whole-wheat flour tortillas. Meats and other proteins Skinless chicken or [place]. Ground chicken or [place]. Pork with fat trimmed off. Fish and seafood. Egg whites. Dried beans, peas, or lentils. Unsalted nuts, nut butters, and seeds. Unsalted canned beans. Lean cuts of beef with fat trimmed off. Low-sodium, lean precooked or cured meat, such as sausages or meat loaves. Dairy Low-fat (1%) or fat-free (skim) milk. Reduced-fat, low-fat, or fat-free cheeses. Nonfat, low-sodium ricotta or cottage cheese. Low-fat or nonfat yogurt. Low-fat, low-sodium cheese. Fats and oils Soft margarine without trans fats. Vegetable oil. Reduced-fat, low-fat, or light mayonnaise and salad dressings (reduced-sodium). Canola, safflower, olive, avocado, soybean, and sunflower oils. Avocado. Seasonings and condiments Herbs. Spices. Seasoning mixes without salt. Other foods Unsalted popcorn and pretzels. Fat-free sweets. The items listed above may not be a complete list of foods and beverages you can eat. Contact a dietitian for more information. What foods should I avoid? Fruits Canned fruit in a light or heavy syrup. Fried fruit. Fruit in cream or butter sauce. Vegetables Creamed or fried vegetables. Vegetables in a cheese sauce. Regular canned vegetables (not low-sodium or reduced-sodium). Regular canned tomato sauce and paste (not low-sodium or reduced-sodium). Regular tomato and vegetable juice (not low-sodium or reduced-sodium). Rosita Fire. Olives. Grains Baked goods made with fat, such as croissants, muffins, or some breads. Dry pasta or rice meal packs. Meats and other proteins Fatty cuts of meat. Ribs. Fried meat. Tomasa Blase. Bologna, salami, and other  precooked or cured meats, such as sausages or meat loaves. Fat from the back of a pig (fatback). Bratwurst. Salted nuts and seeds. Canned beans with added salt. Canned or smoked fish. Whole eggs or egg yolks. Chicken or [place] with skin. Dairy Whole or 2% milk, cream, and half-and-half. Whole or full-fat cream cheese. Whole-fat or sweetened yogurt. Full-fat cheese. Nondairy creamers. Whipped toppings. Processed cheese and cheese spreads. Fats and oils Butter. Stick margarine. Lard. Shortening. Ghee. Bacon fat. Tropical oils, such as coconut, palm kernel, or palm oil. Seasonings and condiments Onion salt, garlic salt, seasoned salt, table salt, and sea salt. Worcestershire sauce. Tartar sauce. Barbecue sauce. Teriyaki sauce. Soy sauce, including reduced-sodium. Steak sauce. Canned and packaged gravies. Fish sauce. Oyster sauce. Cocktail sauce. Store-bought horseradish. Ketchup. Mustard. Meat flavorings and tenderizers. Bouillon cubes. Hot sauces. Pre-made or packaged marinades. Pre-made or packaged taco seasonings. Relishes. Regular salad dressings. Other foods Salted popcorn and pretzels. The items listed above may not be a complete list of foods and beverages you should avoid. Contact a dietitian for more information. Where to find more information National Heart, Lung, and Blood Institute: PopSteam.is American Heart Association: www.heart.org Academy of Nutrition and Dietetics: www.eatright.org National Kidney Foundation: www.kidney.org Summary The DASH eating plan is a healthy eating plan that has been shown to reduce high blood pressure (hypertension). It may also reduce your risk for type 2 diabetes, heart  disease, and stroke. When on the DASH eating plan, aim to eat more fresh fruits and vegetables, whole grains, lean proteins, low-fat dairy, and heart-healthy fats. With the DASH eating plan, you should limit salt (sodium) intake to 2,300 mg a day. If you have hypertension, you may need  to reduce your sodium intake to 1,500 mg a day. Work with your health care provider or dietitian to adjust your eating plan to your individual calorie needs. This information is not intended to replace advice given to you by your health care provider. Make sure you discuss any questions you have with your health care provider. Document Revised: 03/24/2019 Document Reviewed: 03/24/2019 Elsevier Patient Education  2023 [employer].

## 2022-01-15 NOTE — Assessment & Plan Note (Addendum)
Previous use of phentermine, no weight loss Use of ozempic 0.5mg  weekly x 62months, no weight loss noted. Has not made any lifestyle modifications BP Readings from Last 3 Encounters:  01/15/22 (!) 160/98  01/21/21 (!) 158/92  09/05/20 139/83   Wt Readings from Last 3 Encounters:  01/15/22 200 lb 9.6 oz (91 kg)  01/21/21 207 lb 9.6 oz (94.2 kg)  09/05/20 209 lb 3.2 oz (94.9 kg)   Not a candidate for phentermine due to uncontrolled BP, not candidate for contrave due to current wellbutrin rx for depression. Advised about the importance of lifestyle modification in conjunction with weight loss medication. Advised about possible GLP-1 side effects and contraindication with pregnancy. She agreed to use condoms and referral to nutritionist Switch ozempic to wegovy 0.5mg . Check CMP, TSH, hgbA1c F/up in 84month

## 2022-01-15 NOTE — Assessment & Plan Note (Addendum)
She stopped COC 2weeks ago due to improved vaginal bleeding Pelvic US 11/2021: There is inhomogeneous echogenicity in myometrium without focal abnormalities. There is no abnormal increased vascularity in the endometrium.1.4 cm cystic structure in the margin of the left ovary has not changed significantly in comparison with the study of 02/27/2020, possibly a small paraovarian cyst.  Repeat cbc and iron, check urine pregnancy Consider use of micronor due to uncontrolled HTN Entered referral to GYN

## 2022-01-16 LAB — CBC WITH DIFFERENTIAL/PLATELET
Basophils Absolute: 0.1 10*3/uL (ref 0.0–0.1)
Basophils Relative: 1.1 % (ref 0.0–3.0)
Eosinophils Absolute: 0.2 10*3/uL (ref 0.0–0.7)
Eosinophils Relative: 3.9 % (ref 0.0–5.0)
HCT: 32.2 % — ABNORMAL LOW (ref 36.0–46.0)
Hemoglobin: 9.8 g/dL — ABNORMAL LOW (ref 12.0–15.0)
Lymphocytes Relative: 37.5 % (ref 12.0–46.0)
Lymphs Abs: 2.3 10*3/uL (ref 0.7–4.0)
MCHC: 30.5 g/dL (ref 30.0–36.0)
MCV: 64.5 fl — ABNORMAL LOW (ref 78.0–100.0)
Monocytes Absolute: 0.3 10*3/uL (ref 0.1–1.0)
Monocytes Relative: 5.7 % (ref 3.0–12.0)
Neutro Abs: 3.2 10*3/uL (ref 1.4–7.7)
Neutrophils Relative %: 51.8 % (ref 43.0–77.0)
Platelets: 305 10*3/uL (ref 150.0–400.0)
RBC: 4.99 Mil/uL (ref 3.87–5.11)
RDW: 20.4 % — ABNORMAL HIGH (ref 11.5–15.5)
WBC: 6.1 10*3/uL (ref 4.0–10.5)

## 2022-01-16 LAB — IRON,TIBC AND FERRITIN PANEL
%SAT: 28 % (calc) (ref 16–45)
Ferritin: 83 ng/mL (ref 16–154)
Iron: 92 ug/dL (ref 40–190)
TIBC: 328 mcg/dL (calc) (ref 250–450)

## 2022-01-16 LAB — BASIC METABOLIC PANEL
BUN: 11 mg/dL (ref 6–23)
CO2: 25 mEq/L (ref 19–32)
Calcium: 9.2 mg/dL (ref 8.4–10.5)
Chloride: 105 mEq/L (ref 96–112)
Creatinine, Ser: 0.55 mg/dL (ref 0.40–1.20)
GFR: 121.47 mL/min (ref >60.00)
Glucose, Bld: 101 mg/dL — ABNORMAL HIGH (ref 70–99)
Potassium: 3.5 mEq/L (ref 3.5–5.1)
Sodium: 138 mEq/L (ref 135–145)

## 2022-01-16 LAB — HEMOGLOBIN A1C: Hgb A1c MFr Bld: 5.7 % (ref 4.6–6.5)

## 2022-01-16 LAB — TSH: TSH: 1.12 u[IU]/mL (ref 0.35–5.50)

## 2022-01-16 LAB — VITAMIN D 25 HYDROXY (VIT D DEFICIENCY, FRACTURES): VITD: 17.7 ng/mL — ABNORMAL LOW (ref 30.00–100.00)

## 2022-01-16 MED ORDER — VITAMIN D (ERGOCALCIFEROL) 1.25 MG (50000 UNIT) PO CAPS: 50000.0000 [IU] | ORAL_CAPSULE | ORAL | 0 refills | Status: DC

## 2022-01-16 NOTE — Addendum Note (Signed)
Addended by: Alysia Penna L on: 01/16/2022 03:29 PM   Modules accepted: Orders

## 2022-01-27 ENCOUNTER — Other Ambulatory Visit: Payer: Self-pay | Admitting: Nurse Practitioner

## 2022-01-27 DIAGNOSIS — E559 Vitamin D deficiency, unspecified: Secondary | ICD-10-CM

## 2022-02-06 ENCOUNTER — Other Ambulatory Visit: Payer: Self-pay | Admitting: Lab

## 2022-02-06 DIAGNOSIS — I10 Essential (primary) hypertension: Secondary | ICD-10-CM

## 2022-02-06 MED ORDER — AMLODIPINE BESY-BENAZEPRIL HCL 10-20 MG PO CAPS: 1.0000 | ORAL_CAPSULE | Freq: Every day | ORAL | 1 refills | Status: AC

## 2022-02-06 NOTE — Progress Notes (Unsigned)
L/M informing pt we sent prescription over and asked if she had found another provider

## 2022-02-12 ENCOUNTER — Other Ambulatory Visit (HOSPITAL_COMMUNITY): Payer: Self-pay

## 2022-02-12 ENCOUNTER — Ambulatory Visit (INDEPENDENT_AMBULATORY_CARE_PROVIDER_SITE_OTHER): Payer: Commercial Managed Care - HMO | Admitting: Nurse Practitioner

## 2022-02-12 ENCOUNTER — Encounter: Payer: Self-pay | Admitting: Nurse Practitioner

## 2022-02-12 ENCOUNTER — Telehealth: Payer: Self-pay | Admitting: Nurse Practitioner

## 2022-02-12 ENCOUNTER — Ambulatory Visit: Payer: Commercial Managed Care - HMO | Admitting: Dietician

## 2022-02-12 DIAGNOSIS — D500 Iron deficiency anemia secondary to blood loss (chronic): Secondary | ICD-10-CM | POA: Diagnosis not present

## 2022-02-12 DIAGNOSIS — Z6835 Body mass index (BMI) 35.0-35.9, adult: Secondary | ICD-10-CM

## 2022-02-12 DIAGNOSIS — I10 Essential (primary) hypertension: Secondary | ICD-10-CM

## 2022-02-12 DIAGNOSIS — N921 Excessive and frequent menstruation with irregular cycle: Secondary | ICD-10-CM

## 2022-02-12 DIAGNOSIS — R739 Hyperglycemia, unspecified: Secondary | ICD-10-CM

## 2022-02-12 MED ORDER — WEGOVY 0.5 MG/0.5ML ~~LOC~~ SOAJ
0.5000 mg | SUBCUTANEOUS | 2 refills | Status: DC
  Filled 2022-02-12: qty 2, 28d supply, fill #0

## 2022-02-12 MED ORDER — NORETHIN ACE-ETH ESTRAD-FE 1-20 MG-MCG PO TABS
1.0000 | ORAL_TABLET | Freq: Every day | ORAL | 3 refills | Status: AC
  Filled 2022-02-12: qty 84, 84d supply, fill #0

## 2022-02-12 NOTE — Patient Instructions (Signed)
Maintain BP medication Use hydroxyzine prn for anxiety Use trazodone at bedtime for insomnia. Start wegovy injection and junel-fe Start daily exercise.  How to Increase Your Level of Physical Activity Getting regular physical activity is important for your overall health and well-being. Most people do not get enough exercise. There are easy ways to increase your level of physical activity, even if you have not been very active in the past or if you are just starting out. What are the benefits of physical activity? Physical activity has many short-term and long-term benefits. Being active on a regular basis can improve your physical and mental health as well as provide other benefits. Physical health benefits Helping you lose weight or maintain a healthy weight. Strengthening your muscles and bones. Reducing your risk of certain long-term (chronic) diseases, including heart disease, cancer, and diabetes. Being able to move around more easily and for longer periods of time without getting tired (increased endurance or stamina). Improving your ability to fight off illness (enhanced immunity). Being able to sleep better. Helping you stay healthy as you get older, including: Helping you stay mobile, or capable of walking and moving around. Preventing accidents, such as falls. Increasing life expectancy. Mental health benefits Boosting your mood and improving your self-esteem. Lowering your chance of having mental health problems, such as depression or anxiety. Helping you feel good about your body. Other benefits Finding new sources of fun and enjoyment. Meeting new people who share a common interest. Before you begin If you have a chronic illness or have not been active for a while, check with your health care provider about how to get started. Ask your health care provider what activities are safe for you. Start out slowly. Walking or doing some simple chair exercises is a good place to  start, especially if you have not been active before or for a long time. Set goals that you can work toward. Ask your health care provider how much exercise is best for you. In general, most adults should: Do moderate-intensity exercise for at least 150 minutes each week (30 minutes on most days of the week) or vigorous exercise for at least 75 minutes each week, or a combination of these. Moderate-intensity exercise can include walking at a quick pace, biking, yoga, water aerobics, or gardening. Vigorous exercise involves activities that take more effort, such as jogging or running, playing sports, swimming laps, or jumping rope. Do strength exercises on at least 2 days each week. This can include weight lifting, body weight exercises, and resistance-band exercises. How to be more physically active Make a plan  Try to find activities that you enjoy. You are more likely to commit to an exercise routine if it does not feel like a chore. If you have bone or joint problems, choose low-impact exercises, like walking or swimming. Use these tips for being successful with an exercise plan: Find a workout partner for accountability. Join a group or class, such as an aerobics class, cycling class, or sports team. Make family time active. Go for a walk, bike, or swim. Include a variety of exercises each week. Consider using a fitness tracker, such as a mobile phone app or a device worn like a watch, that will count the number of steps you take each day. Many people strive to reach 10,000 steps a day. Find ways to be active in your daily routines Besides your formal exercise plans, you can find ways to do physical activity during your daily routines, such as: Walking  or biking to work or to the store. Taking the stairs instead of the elevator. Parking farther away from the door at work or at the store. Planning walking meetings. Walking around while you are on the phone. Where to find more  information Centers for Disease Control and Prevention: WorkDashboard.es President's Council on Fitness, Sports & Nutrition: www.fitness.gov ChooseMyPlate: MassVoice.es Contact a health care provider if: You have headaches, muscle aches, or joint pain that is concerning. You feel dizzy or light-headed while exercising. You faint. You feel your heart skipping, racing, or fluttering. You have chest pain while exercising. Summary Exercise benefits your mind and body at any age, even if you are just starting out. If you have a chronic illness or have not been active for a while, check with your health care provider before increasing your physical activity. Choose activities that are safe and enjoyable for you. Ask your health care provider what activities are safe for you. Start slowly. Tell your health care provider if you have problems as you start to increase your activity level. This information is not intended to replace advice given to you by your health care provider. Make sure you discuss any questions you have with your health care provider. Document Revised: 08/16/2020 Document Reviewed: 08/16/2020 Elsevier Patient Education  [place].

## 2022-02-12 NOTE — Assessment & Plan Note (Addendum)
Prolonged vaginal bleeding, can last up to 6+weeks with large clots. She is not interested in uterine ablation or hysterectomy at this time, because she home to have another child in the future. LMP 12/21/21-01/09/22 Last PAP completed in 2021: unable to see results. Normal per GYN OV note 02/2020. Pelvic US 11/2021: There is inhomogeneous echogenicity in myometrium without focal abnormalities. There is no abnormal increased vascularity in the Endometrium. 1.4 cm cystic structure in the margin of the left ovary has not changed significantly in comparison with the study of 02/27/2020, possibly a small paraovarian cyst.  Previous use of megace and APRI-COC with minimal improvement. Not sexually active, no tobacco use, no hx of DVT or PE, no FHx of bleeding or clotting disorder. Agreed to start [place]. Advised to skip placebo weeks. F/up in 14months. Needs repeat PAP

## 2022-02-12 NOTE — Progress Notes (Signed)
Established Patient Visit  Patient: [name]   DOB: 1990/06/18   31 y.o. Female  MRN: [id number] Visit Date: 02/12/2022  Subjective:    Chief Complaint  Patient presents with   OFFICE VISIT    Office Visit -HTN No Concerns    HPI Menorrhagia with irregular cycle Prolonged vaginal bleeding, can last up to 6+weeks with large clots. She is not interested in uterine ablation or hysterectomy at this time, because she home to have another child in the future. LMP 12/21/21-01/09/22 Last PAP completed in 2021: unable to see results. Normal per GYN OV note 02/2020. Pelvic US 11/2021: There is inhomogeneous echogenicity in myometrium without focal abnormalities. There is no abnormal increased vascularity in the Endometrium. 1.4 cm cystic structure in the margin of the left ovary has not changed significantly in comparison with the study of 02/27/2020, possibly a small paraovarian cyst.  Previous use of megace and APRI-COC with minimal improvement. Not sexually active, no tobacco use, no hx of DVT or PE, no FHx of bleeding or clotting disorder. Agreed to start [place]. Advised to skip placebo weeks. F/up in 10months. Needs repeat PAP  Iron deficiency anemia due to chronic menstrual blood loss Maintain oral iron supplement Start junel  Primary hypertension Improved BP control with med compliance and DASH diet BP Readings from Last 3 Encounters:  02/12/22 122/78  01/15/22 (!) 160/98  01/21/21 (!) 158/92   Maintain med dose  Obesity Resent wegovy rx to another pharmacy due to drug shortage. Encouraged to start daily exercise. Wt Readings from Last 3 Encounters:  02/12/22 201 lb (91.2 kg)  01/15/22 200 lb 9.6 oz (91 kg)  01/21/21 207 lb 9.6 oz (94.2 kg)   F/up in 80months  BP Readings from Last 3 Encounters:  02/12/22 122/78  01/15/22 (!) 160/98  01/21/21 (!) 158/92    Wt Readings from Last 3 Encounters:  02/12/22 201 lb (91.2 kg)  01/15/22 200 lb 9.6 oz  (91 kg)  01/21/21 207 lb 9.6 oz (94.2 kg)    Reviewed medical, surgical, and social history today  Medications: Outpatient Medications Prior to Visit  Medication Sig   amLODipine-benazepril (LOTREL) 10-20 MG capsule Take 1 capsule by mouth daily.   ferrous sulfate (FERROUSUL) 325 (65 FE) MG tablet Take 1 tablet (325 mg total) by mouth 2 (two) times daily.   hydrOXYzine (ATARAX) 10 MG tablet Take 10 mg by mouth 3 (three) times daily as needed.   Vitamin D, Ergocalciferol, (DRISDOL) 1.25 MG (50000 UNIT) CAPS capsule TAKE 1 CAPSULE BY MOUTH EVERY 7 DAYS   traZODone (DESYREL) 50 MG tablet Take 0.5-1 tablets (25-50 mg total) by mouth at bedtime as needed for sleep. (Patient not taking: Reported on 02/12/2022)   [DISCONTINUED] buPROPion (WELLBUTRIN XL) 150 MG 24 hr tablet Take 1 tablet (150 mg total) by mouth daily for 14 days.   [DISCONTINUED] buPROPion (WELLBUTRIN XL) 300 MG 24 hr tablet Take 1 tablet (300 mg total) by mouth daily. (Patient not taking: Reported on 02/12/2022)   [DISCONTINUED] ibuprofen (ADVIL) 200 MG tablet Take 400 mg by mouth every 6 (six) hours as needed for mild pain. (Patient not taking: Reported on 02/12/2022)   [DISCONTINUED] Semaglutide-Weight Management (WEGOVY) 0.5 MG/0.5ML SOAJ Inject 0.5 mg into the skin once a week.   No facility-administered medications prior to visit.   Reviewed past medical and social history.   ROS per HPI above  Objective:  BP 122/78 (BP Location: Right Arm, Patient Position: Sitting, Cuff Size: Large)   Pulse 88   Temp (!) 97.4 F (36.3 C) (Temporal)   Ht 5\' 3"  (1.6 m)   Wt 201 lb (91.2 kg)   SpO2 99%   BMI 35.61 kg/m      Physical Exam Constitutional:      Appearance: She is obese.  Cardiovascular:     Rate and Rhythm: Normal rate and regular rhythm.     Pulses: Normal pulses.     Heart sounds: Normal heart sounds.  Pulmonary:     Effort: Pulmonary effort is normal.     Breath sounds: Normal breath sounds.   Musculoskeletal:     Right lower leg: No edema.     Left lower leg: No edema.  Neurological:     Mental Status: She is alert.     No results found for any visits on 02/12/22.    Assessment & Plan:    Problem List Items Addressed This Visit       Cardiovascular and Mediastinum   Primary hypertension    Improved BP control with med compliance and DASH diet BP Readings from Last 3 Encounters:  02/12/22 122/78  01/15/22 (!) 160/98  01/21/21 (!) 158/92   Maintain med dose        Other   Iron deficiency anemia due to chronic menstrual blood loss    Maintain oral iron supplement Start junel      Menorrhagia with irregular cycle    Prolonged vaginal bleeding, can last up to 6+weeks with large clots. She is not interested in uterine ablation or hysterectomy at this time, because she home to have another child in the future. LMP 12/21/21-01/09/22 Last PAP completed in 2021: unable to see results. Normal per GYN OV note 02/2020. Pelvic 03/2020 11/2021: There is inhomogeneous echogenicity in myometrium without focal abnormalities. There is no abnormal increased vascularity in the Endometrium. 1.4 cm cystic structure in the margin of the left ovary has not changed significantly in comparison with the study of 02/27/2020, possibly a small paraovarian cyst.  Previous use of megace and APRI-COC with minimal improvement. Not sexually active, no tobacco use, no hx of DVT or PE, no FHx of bleeding or clotting disorder. Agreed to start Junel-fe. Advised to skip placebo weeks. F/up in 58months. Needs repeat PAP      Relevant Medications   norethindrone-ethinyl estradiol-FE (JUNEL FE 1/20) 1-20 MG-MCG tablet   Obesity    Resent wegovy rx to another pharmacy due to drug shortage. Encouraged to start daily exercise. Wt Readings from Last 3 Encounters:  02/12/22 201 lb (91.2 kg)  01/15/22 200 lb 9.6 oz (91 kg)  01/21/21 207 lb 9.6 oz (94.2 kg)   F/up in 40months      Relevant Medications    Semaglutide-Weight Management (WEGOVY) 0.5 MG/0.5ML SOAJ   Return in about 2 months (around 04/14/2022) for Anemia, Weight management and menorrhagia (need PAP, cbc, iron, cmp).     14/04/2022, NP

## 2022-02-12 NOTE — Assessment & Plan Note (Signed)
Resent wegovy rx to another pharmacy due to drug shortage. Encouraged to start daily exercise. Wt Readings from Last 3 Encounters:  02/12/22 201 lb (91.2 kg)  01/15/22 200 lb 9.6 oz (91 kg)  01/21/21 207 lb 9.6 oz (94.2 kg)   F/up in 36months

## 2022-02-12 NOTE — Assessment & Plan Note (Signed)
Maintain oral iron supplement Start junel

## 2022-02-12 NOTE — Assessment & Plan Note (Signed)
Improved BP control with med compliance and DASH diet BP Readings from Last 3 Encounters:  02/12/22 122/78  01/15/22 (!) 160/98  01/21/21 (!) 158/92   Maintain med dose

## 2022-02-12 NOTE — Telephone Encounter (Signed)
Pt states she's been to a 2nd pharmacy to pick up [facility] and was advised it is on national backorder. Pt requesting RX be changed to something else.   Call back # [phone]

## 2022-02-16 ENCOUNTER — Other Ambulatory Visit (HOSPITAL_COMMUNITY): Payer: Self-pay

## 2022-02-16 MED ORDER — SAXENDA 18 MG/3ML ~~LOC~~ SOPN: PEN_INJECTOR | SUBCUTANEOUS | 0 refills | Status: DC

## 2022-02-16 NOTE — Telephone Encounter (Signed)
Pt advised of switch

## 2022-02-17 ENCOUNTER — Other Ambulatory Visit: Payer: Self-pay

## 2022-02-17 ENCOUNTER — Other Ambulatory Visit (HOSPITAL_COMMUNITY): Payer: Self-pay

## 2022-02-17 ENCOUNTER — Telehealth: Payer: Self-pay | Admitting: Nurse Practitioner

## 2022-02-17 DIAGNOSIS — R739 Hyperglycemia, unspecified: Secondary | ICD-10-CM

## 2022-02-17 MED ORDER — SAXENDA 18 MG/3ML ~~LOC~~ SOPN
PEN_INJECTOR | SUBCUTANEOUS | 0 refills | Status: DC
  Filled 2022-02-17: qty 3, fill #0
  Filled 2022-02-17: qty 3, 56d supply, fill #0

## 2022-02-17 NOTE — Telephone Encounter (Signed)
Pt stated she want the wygovy now because the medication you changed it to is 300.00

## 2022-02-17 NOTE — Telephone Encounter (Signed)
Pt stated that her pharmacy is out of the saxenda and wanted to know [facility] can [facility] if it is in stock there. Please call the pt to let her know

## 2022-02-17 NOTE — Telephone Encounter (Signed)
Medication sent to [place]

## 2022-02-18 ENCOUNTER — Other Ambulatory Visit (HOSPITAL_COMMUNITY): Payer: Self-pay

## 2022-02-18 ENCOUNTER — Other Ambulatory Visit: Payer: Self-pay

## 2022-02-18 MED ORDER — WEGOVY 0.5 MG/0.5ML ~~LOC~~ SOAJ: 0.5000 mg | SUBCUTANEOUS | 0 refills | Status: DC

## 2022-02-18 MED ORDER — WEGOVY 0.5 MG/0.5ML ~~LOC~~ SOAJ
0.5000 mg | SUBCUTANEOUS | 0 refills | Status: AC
  Filled 2022-02-18 – 2022-05-06 (×3): qty 2, 28d supply, fill #0

## 2022-02-19 ENCOUNTER — Ambulatory Visit: Payer: Commercial Managed Care - HMO | Admitting: Skilled Nursing Facility1

## 2022-02-27 ENCOUNTER — Other Ambulatory Visit: Payer: Self-pay

## 2022-03-12 ENCOUNTER — Ambulatory Visit: Payer: Commercial Managed Care - HMO | Admitting: Skilled Nursing Facility1

## 2022-03-19 ENCOUNTER — Other Ambulatory Visit: Payer: Self-pay

## 2022-03-31 ENCOUNTER — Emergency Department (HOSPITAL_COMMUNITY): Payer: Commercial Managed Care - HMO

## 2022-03-31 ENCOUNTER — Emergency Department (HOSPITAL_COMMUNITY)
Admission: EM | Admit: 2022-03-31 | Discharge: 2022-03-31 | Discharge status: Against Medical Advice | Payer: Commercial Managed Care - HMO | Attending: Emergency Medicine | Admitting: Emergency Medicine

## 2022-03-31 DIAGNOSIS — R112 Nausea with vomiting, unspecified: Secondary | ICD-10-CM | POA: Insufficient documentation

## 2022-03-31 DIAGNOSIS — R1013 Epigastric pain: Secondary | ICD-10-CM | POA: Diagnosis not present

## 2022-03-31 DIAGNOSIS — R079 Chest pain, unspecified: Secondary | ICD-10-CM | POA: Diagnosis not present

## 2022-03-31 DIAGNOSIS — Z5321 Procedure and treatment not carried out due to patient leaving prior to being seen by health care provider: Secondary | ICD-10-CM | POA: Diagnosis not present

## 2022-03-31 LAB — CBC
HCT: 39 % (ref 36.0–46.0)
Hemoglobin: 11.9 g/dL — ABNORMAL LOW (ref 12.0–15.0)
MCH: 20.8 pg — ABNORMAL LOW (ref 26.0–34.0)
MCHC: 30.5 g/dL (ref 30.0–36.0)
MCV: 68.2 fL — ABNORMAL LOW (ref 80.0–100.0)
Platelets: 386 10*3/uL (ref 150–400)
RBC: 5.72 MIL/uL — ABNORMAL HIGH (ref 3.87–5.11)
RDW: 22.5 % — ABNORMAL HIGH (ref 11.5–15.5)
WBC: 9.9 10*3/uL (ref 4.0–10.5)
nRBC: 0 % (ref 0.0–0.2)

## 2022-03-31 LAB — PREGNANCY, URINE: Preg Test, Ur: NEGATIVE

## 2022-03-31 LAB — BASIC METABOLIC PANEL
Anion gap: 11 (ref 5–15)
BUN: 8 mg/dL (ref 6–20)
CO2: 21 mmol/L — ABNORMAL LOW (ref 22–32)
Calcium: 9.2 mg/dL (ref 8.9–10.3)
Chloride: 101 mmol/L (ref 98–111)
Creatinine, Ser: 0.51 mg/dL (ref 0.44–1.00)
GFR, Estimated: >60 mL/min (ref >60)
Glucose, Bld: 128 mg/dL — ABNORMAL HIGH (ref 70–99)
Potassium: 3.3 mmol/L — ABNORMAL LOW (ref 3.5–5.1)
Sodium: 133 mmol/L — ABNORMAL LOW (ref 135–145)

## 2022-03-31 LAB — LIPASE, BLOOD: Lipase: 27 U/L (ref 11–51)

## 2022-03-31 LAB — TROPONIN I (HIGH SENSITIVITY): Troponin I (High Sensitivity): <2 ng/L (ref <18)

## 2022-03-31 NOTE — ED Triage Notes (Signed)
Patient arrived with complaints of sharp epigastric pain since yesterday with no relief from antiacids. Now having NV.

## 2022-04-01 ENCOUNTER — Telehealth: Payer: Self-pay

## 2022-04-01 NOTE — Telephone Encounter (Signed)
Transition Care Management Follow-up Telephone Call Date of discharge and from where: 03/31/22 [facility] ED. Pt left AMA before being seen. States she waited 8 hours. How have you been since you were released from the hospital? I'm ok Any questions or concerns? No  Items Reviewed: Did the pt receive and understand the discharge instructions provided?  Pt left before being evaluated Medications obtained and verified?  N/a Other?  N/a Any new allergies since your discharge? No  Dietary orders reviewed? No Do you have support at home? Yes   Home Care and Equipment/Supplies: Were home health services ordered? not applicable If so, what is the name of the agency? N/a  Has the agency set up a time to come to the patient's home? not applicable Were any new equipment or medical supplies ordered?  No What is the name of the medical supply agency? N/a Were you able to get the supplies/equipment? not applicable Do you have any questions related to the use of the equipment or supplies? No  Functional Questionnaire: (I = Independent and D = Dependent) ADLs: I  Bathing/Dressing- I  Meal Prep- I  Eating- I  Maintaining continence- I  Transferring/Ambulation- I  Managing Meds- I  Follow up appointments reviewed:  PCP Hospital f/u appt confirmed? No  Scheduled to see N/A on N/A @ N/A. Pt states she is unable to miss any more days of work and is attending school. She is unable to schedule an appt. Advised to go to Urgent Care or return to the ED. Specialist Hospital f/u appt confirmed? No  Scheduled to see N/A on N/A @ N/A. Are transportation arrangements needed? No  If their condition worsens, is the pt aware to call PCP or go to the Emergency Dept.? Yes Was the patient provided with contact information for the PCP's office or ED? Yes Was to pt encouraged to call back with questions or concerns? Yes

## 2022-04-16 ENCOUNTER — Encounter: Payer: Self-pay | Admitting: Nurse Practitioner

## 2022-04-16 ENCOUNTER — Ambulatory Visit (INDEPENDENT_AMBULATORY_CARE_PROVIDER_SITE_OTHER): Payer: Commercial Managed Care - HMO | Admitting: Nurse Practitioner

## 2022-04-16 VITALS — BP 140/90 | HR 84 | Temp 98.0°F | Ht 63.0 in | Wt 203.6 lb

## 2022-04-16 DIAGNOSIS — Z6836 Body mass index (BMI) 36.0-36.9, adult: Secondary | ICD-10-CM

## 2022-04-16 DIAGNOSIS — E559 Vitamin D deficiency, unspecified: Secondary | ICD-10-CM | POA: Diagnosis not present

## 2022-04-16 DIAGNOSIS — R1013 Epigastric pain: Secondary | ICD-10-CM

## 2022-04-16 DIAGNOSIS — I10 Essential (primary) hypertension: Secondary | ICD-10-CM | POA: Diagnosis not present

## 2022-04-16 DIAGNOSIS — D500 Iron deficiency anemia secondary to blood loss (chronic): Secondary | ICD-10-CM

## 2022-04-16 NOTE — Patient Instructions (Signed)
It is important to take medications daily and maintain a low sodium diet. Need daily exercise Go to lab  DASH Eating Plan DASH stands for Dietary Approaches to Stop Hypertension. The DASH eating plan is a healthy eating plan that has been shown to: Reduce high blood pressure (hypertension). Reduce your risk for type 2 diabetes, heart disease, and stroke. Help with weight loss. What are tips for following this plan? Reading food labels Check food labels for the amount of salt (sodium) per serving. Choose foods with less than 5 percent of the Daily Value of sodium. Generally, foods with less than 300 milligrams (mg) of sodium per serving fit into this eating plan. To find whole grains, look for the word "whole" as the first word in the ingredient list. Shopping Buy products labeled as "low-sodium" or "no salt added." Buy fresh foods. Avoid canned foods and pre-made or frozen meals. Cooking Avoid adding salt when cooking. Use salt-free seasonings or herbs instead of table salt or sea salt. Check with your health care provider or pharmacist before using salt substitutes. Do not fry foods. Cook foods using healthy methods such as baking, boiling, grilling, roasting, and broiling instead. Cook with heart-healthy oils, such as olive, canola, avocado, soybean, or sunflower oil. Meal planning  Eat a balanced diet that includes: 4 or more servings of fruits and 4 or more servings of vegetables each day. Try to fill one-half of your plate with fruits and vegetables. 6-8 servings of whole grains each day. Less than 6 oz (170 g) of lean meat, poultry, or fish each day. A 3-oz (85-g) serving of meat is about the same size as a deck of cards. One egg equals 1 oz (28 g). 2-3 servings of low-fat dairy each day. One serving is 1 cup (237 mL). 1 serving of nuts, seeds, or beans 5 times each week. 2-3 servings of heart-healthy fats. Healthy fats called omega-3 fatty acids are found in foods such as walnuts,  flaxseeds, fortified milks, and eggs. These fats are also found in cold-water fish, such as sardines, salmon, and mackerel. Limit how much you eat of: Canned or prepackaged foods. Food that is high in trans fat, such as some fried foods. Food that is high in saturated fat, such as fatty meat. Desserts and other sweets, sugary drinks, and other foods with added sugar. Full-fat dairy products. Do not salt foods before eating. Do not eat more than 4 egg yolks a week. Try to eat at least 2 vegetarian meals a week. Eat more home-cooked food and less restaurant, buffet, and fast food. Lifestyle When eating at a restaurant, ask that your food be prepared with less salt or no salt, if possible. If you drink alcohol: Limit how much you use to: 0-1 drink a day for women who are not pregnant. 0-2 drinks a day for men. Be aware of how much alcohol is in your drink. In the [place] one drink equals one 12 oz bottle of beer (355 mL), one 5 oz glass of wine (148 mL), or one 1 oz glass of hard liquor (44 mL). General information Avoid eating more than 2,300 mg of salt a day. If you have hypertension, you may need to reduce your sodium intake to 1,500 mg a day. Work with your health care provider to maintain a healthy body weight or to lose weight. Ask what an ideal weight is for you. Get at least 30 minutes of exercise that causes your heart to beat faster (aerobic exercise) most  days of the week. Activities may include walking, swimming, or biking. Work with your health care provider or dietitian to adjust your eating plan to your individual calorie needs. What foods should I eat? Fruits All fresh, dried, or frozen fruit. Canned fruit in natural juice (without added sugar). Vegetables Fresh or frozen vegetables (raw, steamed, roasted, or grilled). Low-sodium or reduced-sodium tomato and vegetable juice. Low-sodium or reduced-sodium tomato sauce and tomato paste. Low-sodium or reduced-sodium canned  vegetables. Grains Whole-grain or whole-wheat bread. Whole-grain or whole-wheat pasta. Brown rice. [name]. Bulgur. Whole-grain and low-sodium cereals. Pita bread. Low-fat, low-sodium crackers. Whole-wheat flour tortillas. Meats and other proteins Skinless chicken or [place]. Ground chicken or [place]. Pork with fat trimmed off. Fish and seafood. Egg whites. Dried beans, peas, or lentils. Unsalted nuts, nut butters, and seeds. Unsalted canned beans. Lean cuts of beef with fat trimmed off. Low-sodium, lean precooked or cured meat, such as sausages or meat loaves. Dairy Low-fat (1%) or fat-free (skim) milk. Reduced-fat, low-fat, or fat-free cheeses. Nonfat, low-sodium ricotta or cottage cheese. Low-fat or nonfat yogurt. Low-fat, low-sodium cheese. Fats and oils Soft margarine without trans fats. Vegetable oil. Reduced-fat, low-fat, or light mayonnaise and salad dressings (reduced-sodium). Canola, safflower, olive, avocado, soybean, and sunflower oils. Avocado. Seasonings and condiments Herbs. Spices. Seasoning mixes without salt. Other foods Unsalted popcorn and pretzels. Fat-free sweets. The items listed above may not be a complete list of foods and beverages you can eat. Contact a dietitian for more information. What foods should I avoid? Fruits Canned fruit in a light or heavy syrup. Fried fruit. Fruit in cream or butter sauce. Vegetables Creamed or fried vegetables. Vegetables in a cheese sauce. Regular canned vegetables (not low-sodium or reduced-sodium). Regular canned tomato sauce and paste (not low-sodium or reduced-sodium). Regular tomato and vegetable juice (not low-sodium or reduced-sodium). [name]. Olives. Grains Baked goods made with fat, such as croissants, muffins, or some breads. Dry pasta or rice meal packs. Meats and other proteins Fatty cuts of meat. Ribs. Fried meat. [name]. Bologna, salami, and other precooked or cured meats, such as sausages or meat loaves. Fat from  the back of a pig (fatback). Bratwurst. Salted nuts and seeds. Canned beans with added salt. Canned or smoked fish. Whole eggs or egg yolks. Chicken or [place] with skin. Dairy Whole or 2% milk, cream, and half-and-half. Whole or full-fat cream cheese. Whole-fat or sweetened yogurt. Full-fat cheese. Nondairy creamers. Whipped toppings. Processed cheese and cheese spreads. Fats and oils Butter. Stick margarine. Lard. Shortening. Ghee. Bacon fat. Tropical oils, such as coconut, palm kernel, or palm oil. Seasonings and condiments Onion salt, garlic salt, seasoned salt, table salt, and sea salt. Worcestershire sauce. Tartar sauce. Barbecue sauce. Teriyaki sauce. Soy sauce, including reduced-sodium. Steak sauce. Canned and packaged gravies. Fish sauce. Oyster sauce. Cocktail sauce. Store-bought horseradish. Ketchup. Mustard. Meat flavorings and tenderizers. Bouillon cubes. Hot sauces. Pre-made or packaged marinades. Pre-made or packaged taco seasonings. Relishes. Regular salad dressings. Other foods Salted popcorn and pretzels. The items listed above may not be a complete list of foods and beverages you should avoid. Contact a dietitian for more information. Where to find more information National Heart, Lung, and Blood Institute: https://wilson-eaton.com/ American Heart Association: www.heart.org Academy of Nutrition and Dietetics: www.eatright.[place]: www.kidney.org Summary The DASH eating plan is a healthy eating plan that has been shown to reduce high blood pressure (hypertension). It may also reduce your risk for type 2 diabetes, heart disease, and stroke. When on the DASH eating plan,  aim to eat more fresh fruits and vegetables, whole grains, lean proteins, low-fat dairy, and heart-healthy fats. With the DASH eating plan, you should limit salt (sodium) intake to 2,300 mg a day. If you have hypertension, you may need to reduce your sodium intake to 1,500 mg a day. Work with your  health care provider or dietitian to adjust your eating plan to your individual calorie needs. This information is not intended to replace advice given to you by your health care provider. Make sure you discuss any questions you have with your health care provider. Document Revised: 03/24/2019 Document Reviewed: 03/24/2019 Elsevier Patient Education  2023 [employer].

## 2022-04-17 ENCOUNTER — Encounter: Payer: Self-pay | Admitting: Nurse Practitioner

## 2022-04-17 ENCOUNTER — Other Ambulatory Visit (HOSPITAL_COMMUNITY): Payer: Self-pay

## 2022-04-17 LAB — BASIC METABOLIC PANEL
BUN: 10 mg/dL (ref 6–23)
CO2: 26 mEq/L (ref 19–32)
Calcium: 9.5 mg/dL (ref 8.4–10.5)
Chloride: 103 mEq/L (ref 96–112)
Creatinine, Ser: 0.51 mg/dL (ref 0.40–1.20)
GFR: 123.49 mL/min (ref >60.00)
Glucose, Bld: 101 mg/dL — ABNORMAL HIGH (ref 70–99)
Potassium: 3.8 mEq/L (ref 3.5–5.1)
Sodium: 140 mEq/L (ref 135–145)

## 2022-04-17 LAB — IBC + FERRITIN
Ferritin: 3.4 ng/mL — ABNORMAL LOW (ref 10.0–291.0)
Iron: 19 ug/dL — ABNORMAL LOW (ref 42–145)
Saturation Ratios: 3.1 % — ABNORMAL LOW (ref 20.0–50.0)
TIBC: 606.2 ug/dL — ABNORMAL HIGH (ref 250.0–450.0)
Transferrin: 433 mg/dL — ABNORMAL HIGH (ref 212.0–360.0)

## 2022-04-17 LAB — VITAMIN D 25 HYDROXY (VIT D DEFICIENCY, FRACTURES): VITD: 27.34 ng/mL — ABNORMAL LOW (ref 30.00–100.00)

## 2022-04-17 MED ORDER — VITAMIN D (ERGOCALCIFEROL) 1.25 MG (50000 UNIT) PO CAPS
50000.0000 [IU] | ORAL_CAPSULE | ORAL | 0 refills | Status: AC
  Filled 2022-04-17: qty 12, 84d supply, fill #0

## 2022-04-17 NOTE — Assessment & Plan Note (Signed)
Has not been able to start wegovy due to lack of coverage and drug shortage. Advised to continue heart healthy diet, small meall portions and daily exercise Wt Readings from Last 3 Encounters:  04/16/22 203 lb 9.6 oz (92.4 kg)  02/12/22 201 lb (91.2 kg)  01/15/22 200 lb 9.6 oz (91 kg)

## 2022-04-17 NOTE — Assessment & Plan Note (Signed)
BP not at goal due to med and diet non compliance. BP Readings from Last 3 Encounters:  04/16/22 (!) 140/90  03/31/22 (!) 152/100  02/12/22 122/78    Advised about the importance of medication and diet compliance. Advised about possible complications of uncontrolled HTN. F/up in 34month

## 2022-04-17 NOTE — Progress Notes (Signed)
Established Patient Visit  Patient: [name]   DOB: July 30, 1990   31 y.o. Female  MRN: [id number] Visit Date: 04/17/2022  Subjective:    Chief Complaint  Patient presents with   Office Visit    Anemia/ weight management  Unable to get [facility]  ED f/u 03/31/22   Abdominal Pain This is a new problem. The current episode started 1 to 4 weeks ago. The onset quality is sudden. The problem occurs constantly. The problem has been waxing and waning. The pain is located in the epigastric region. The pain is severe. The quality of the pain is colicky and a sensation of fullness. The abdominal pain does not radiate. Associated symptoms include anorexia, nausea and vomiting. Pertinent negatives include no arthralgias, belching, constipation, diarrhea, dysuria, fever, flatus, frequency, headaches, hematochezia, hematuria, melena, myalgias or weight loss. Nothing aggravates the pain. The pain is relieved by Nothing. She has tried nothing for the symptoms. There is no history of abdominal surgery, colon cancer, Crohn's disease, gallstones, GERD, irritable bowel syndrome, pancreatitis or PUD.   Obesity Has not been able to start wegovy due to lack of coverage and drug shortage. Advised to continue heart healthy diet, small meall portions and daily exercise Wt Readings from Last 3 Encounters:  04/16/22 203 lb 9.6 oz (92.4 kg)  02/12/22 201 lb (91.2 kg)  01/15/22 200 lb 9.6 oz (91 kg)     Iron deficiency anemia due to chronic menstrual blood loss Unable to tolerate oral iron supplement Improved vaginal bleeding with COC. Repeat iron panel today  Primary hypertension BP not at goal due to med and diet non compliance. BP Readings from Last 3 Encounters:  04/16/22 (!) 140/90  03/31/22 (!) 152/100  02/12/22 122/78    Advised about the importance of medication and diet compliance. Advised about possible complications of uncontrolled HTN. F/up in 49month  Reviewed medical, surgical,  and social history today  Medications: Outpatient Medications Prior to Visit  Medication Sig   amLODipine-benazepril (LOTREL) 10-20 MG capsule Take 1 capsule by mouth daily.   ferrous sulfate (FERROUSUL) 325 (65 FE) MG tablet Take 1 tablet (325 mg total) by mouth 2 (two) times daily.   hydrOXYzine (ATARAX) 10 MG tablet Take 10 mg by mouth 3 (three) times daily as needed.   norethindrone-ethinyl estradiol-FE (JUNEL FE 1/20) 1-20 MG-MCG tablet Take 1 tablet by mouth daily.   Semaglutide-Weight Management (WEGOVY) 0.5 MG/0.5ML SOAJ Inject 0.5 mg into the skin once a week. (Patient not taking: Reported on 04/16/2022)   traZODone (DESYREL) 50 MG tablet Take 0.5-1 tablets (25-50 mg total) by mouth at bedtime as needed for sleep. (Patient not taking: Reported on 02/12/2022)   [DISCONTINUED] Vitamin D, Ergocalciferol, (DRISDOL) 1.25 MG (50000 UNIT) CAPS capsule TAKE 1 CAPSULE BY MOUTH EVERY 7 DAYS (Patient not taking: Reported on 04/16/2022)   No facility-administered medications prior to visit.   Reviewed past medical and social history.   ROS per HPI above  Last CBC Lab Results  Component Value Date   WBC 9.9 03/31/2022   HGB 11.9 (L) 03/31/2022   HCT 39.0 03/31/2022   MCV 68.2 (L) 03/31/2022   MCH 20.8 (L) 03/31/2022   RDW 22.5 (H) 03/31/2022   PLT 386 [id number]   Last metabolic panel Lab Results  Component Value Date   GLUCOSE 101 (H) 04/16/2022   NA 140 04/16/2022   K 3.8 04/16/2022   CL 103 04/16/2022  CO2 26 04/16/2022   BUN 10 04/16/2022   CREATININE 0.51 04/16/2022   GFRNONAA >60 03/31/2022   CALCIUM 9.5 04/16/2022   PROT 7.4 02/20/2020   ALBUMIN 4.2 02/20/2020   LABGLOB 3.2 02/20/2020   AGRATIO 1.3 02/20/2020   BILITOT 0.3 02/20/2020   ALKPHOS 66 02/20/2020   AST 12 02/20/2020   ALT 12 02/20/2020   ANIONGAP 11 03/31/2022      Objective:  BP (!) 140/90   Pulse 84   Temp 98 F (36.7 C) (Temporal)   Ht 5\' 3"  (1.6 m)   Wt 203 lb 9.6 oz (92.4 kg)   LMP  04/02/2022 (Exact Date)   SpO2 97%   BMI 36.07 kg/m      Physical Exam Vitals reviewed.  Cardiovascular:     Rate and Rhythm: Normal rate and regular rhythm.     Pulses: Normal pulses.     Heart sounds: Normal heart sounds.  Pulmonary:     Effort: Pulmonary effort is normal.     Breath sounds: Normal breath sounds.  Abdominal:     General: Bowel sounds are normal. There is no distension.     Palpations: Abdomen is soft. There is no mass.     Tenderness: There is no abdominal tenderness. There is no right CVA tenderness, left CVA tenderness, guarding or rebound.     Hernia: No hernia is present.  Musculoskeletal:     Right lower leg: No edema.     Left lower leg: No edema.  Neurological:     Mental Status: She is alert.     Results for orders placed or performed in visit on 04/16/22  IBC + Ferritin  Result Value Ref Range   Iron 19 (L) 42 - 145 ug/dL   Transferrin 04/18/22 (H) 212.0 - 360.0 mg/dL   Saturation Ratios 3.1 (L) 20.0 - 50.0 %   Ferritin 3.4 (L) 10.0 - 291.0 ng/mL   TIBC 606.2 (H) 250.0 - 450.0 mcg/dL  Basic metabolic panel  Result Value Ref Range   Sodium 140 135 - 145 mEq/L   Potassium 3.8 3.5 - 5.1 mEq/L   Chloride 103 96 - 112 mEq/L   CO2 26 19 - 32 mEq/L   Glucose, Bld 101 (H) 70 - 99 mg/dL   BUN 10 6 - 23 mg/dL   Creatinine, Ser 081.4 0.40 - 1.20 mg/dL   GFR 4.81 856.31 mL/min   Calcium 9.5 8.4 - 10.5 mg/dL  Vitamin D (25 hydroxy)  Result Value Ref Range   VITD 27.34 (L) 30.00 - 100.00 ng/mL      Assessment & Plan:    Problem List Items Addressed This Visit       Cardiovascular and Mediastinum   Primary hypertension    BP not at goal due to med and diet non compliance. BP Readings from Last 3 Encounters:  04/16/22 (!) 140/90  03/31/22 (!) 152/100  02/12/22 122/78    Advised about the importance of medication and diet compliance. Advised about possible complications of uncontrolled HTN. F/up in 36month      Relevant Orders   Basic  metabolic panel (Completed)     Other   Iron deficiency anemia due to chronic menstrual blood loss    Unable to tolerate oral iron supplement Improved vaginal bleeding with COC. Repeat iron panel today      Relevant Orders   IBC + Ferritin (Completed)   Ambulatory referral to Hematology / Oncology   Obesity    Has not  been able to start wegovy due to lack of coverage and drug shortage. Advised to continue heart healthy diet, small meall portions and daily exercise Wt Readings from Last 3 Encounters:  04/16/22 203 lb 9.6 oz (92.4 kg)  02/12/22 201 lb (91.2 kg)  01/15/22 200 lb 9.6 oz (91 kg)         Other Visit Diagnoses     Epigastric pain    -  Primary   Relevant Orders   US Abdomen Limited RUQ (LIVER/GB)   Vitamin D deficiency       Relevant Medications   Vitamin D, Ergocalciferol, (DRISDOL) 1.25 MG (50000 UNIT) CAPS capsule   Other Relevant Orders   Vitamin D (25 hydroxy) (Completed)      Return in about 4 weeks (around 05/14/2022) for HTN.     Wilfred Lacy, NP

## 2022-04-17 NOTE — Assessment & Plan Note (Signed)
Unable to tolerate oral iron supplement Improved vaginal bleeding with COC. Repeat iron panel today

## 2022-04-20 ENCOUNTER — Telehealth: Payer: Self-pay | Admitting: Hematology and Oncology

## 2022-04-20 NOTE — Telephone Encounter (Signed)
Scheduled appointment per referral. Patient is aware of appointment date and time. Patient is aware to arrive 15 mins prior to appointment time and to bring updated insurance cards. Patient is aware of location.   

## 2022-05-06 ENCOUNTER — Other Ambulatory Visit: Payer: Self-pay

## 2022-05-06 ENCOUNTER — Inpatient Hospital Stay: Admission: RE | Admit: 2022-05-06 | Payer: Commercial Managed Care - HMO | Source: Ambulatory Visit

## 2022-05-07 ENCOUNTER — Other Ambulatory Visit: Payer: Self-pay

## 2022-05-11 ENCOUNTER — Inpatient Hospital Stay: Payer: BLUE CROSS/BLUE SHIELD | Attending: Hematology and Oncology | Admitting: Hematology and Oncology

## 2022-05-11 ENCOUNTER — Other Ambulatory Visit: Payer: Self-pay

## 2022-05-11 ENCOUNTER — Inpatient Hospital Stay: Payer: BLUE CROSS/BLUE SHIELD

## 2022-05-15 ENCOUNTER — Other Ambulatory Visit (HOSPITAL_COMMUNITY): Payer: Self-pay

## 2022-05-15 ENCOUNTER — Other Ambulatory Visit: Payer: Self-pay

## 2022-05-19 ENCOUNTER — Telehealth: Payer: Self-pay | Admitting: Nurse Practitioner

## 2022-05-19 ENCOUNTER — Other Ambulatory Visit: Payer: Self-pay

## 2022-05-19 ENCOUNTER — Other Ambulatory Visit (HOSPITAL_COMMUNITY): Payer: Self-pay

## 2022-05-19 NOTE — Telephone Encounter (Signed)
Pharmacy Patient Advocate Encounter   Received notification that prior authorization for [facility] 0.5MG /0.5ML auto-injectors is required/requested.   PA submitted on 05/19/22 to (ins) [employer] via [employer]  [id number] Status is pending

## 2022-05-19 NOTE — Telephone Encounter (Signed)
Pharmacy Patient Advocate Encounter  Received notification from [facility] that the request for prior authorization for [facility] 0.5MG /0.5ML has been denied due to .    You may call [phone] or fax [phone], to appeal.  Please be advised we currently do not have a Pharmacist to review denials. If you would like [place] to submit it on your behalf, please provide clinical information to support your reason for appeal and any pertinent information you would like [place] to include with the appeal request. Appeals may take longer 5 business days to be submitted as we prepares necessary documentation. Thanks for your support.  How would you like to proceed?

## 2022-05-19 NOTE — Telephone Encounter (Signed)
Caller Name: Nielle Call back phone #: [phone]  Reason for Call: pt called stating new insurance [place] is requesting prior auth for [facility]. Please submist PA request.   Pt was notified insurance now OON with our office. She said she will call ins but will likely have to change PCP.

## 2022-05-20 NOTE — Telephone Encounter (Signed)
Pt advised via detailed VM, she is to call back with any further questions.

## 2022-05-21 ENCOUNTER — Ambulatory Visit: Payer: Commercial Managed Care - HMO | Admitting: Nurse Practitioner

## 2023-02-07 ENCOUNTER — Other Ambulatory Visit: Payer: Self-pay

## 2023-02-07 ENCOUNTER — Emergency Department (HOSPITAL_BASED_OUTPATIENT_CLINIC_OR_DEPARTMENT_OTHER)
Admission: EM | Admit: 2023-02-07 | Discharge: 2023-02-07 | Disposition: A | Discharge status: Home / Self Care | Payer: BLUE CROSS/BLUE SHIELD | Attending: Emergency Medicine | Admitting: Emergency Medicine

## 2023-02-07 ENCOUNTER — Encounter (HOSPITAL_BASED_OUTPATIENT_CLINIC_OR_DEPARTMENT_OTHER): Payer: Self-pay | Admitting: Emergency Medicine

## 2023-02-07 DIAGNOSIS — S0083XA Contusion of other part of head, initial encounter: Secondary | ICD-10-CM | POA: Insufficient documentation

## 2023-02-07 DIAGNOSIS — M542 Cervicalgia: Secondary | ICD-10-CM | POA: Diagnosis not present

## 2023-02-07 DIAGNOSIS — Z794 Long term (current) use of insulin: Secondary | ICD-10-CM | POA: Diagnosis not present

## 2023-02-07 DIAGNOSIS — W228XXA Striking against or struck by other objects, initial encounter: Secondary | ICD-10-CM | POA: Insufficient documentation

## 2023-02-07 DIAGNOSIS — S0990XA Unspecified injury of head, initial encounter: Secondary | ICD-10-CM

## 2023-02-07 NOTE — Discharge Instructions (Signed)
Take 4 over the counter ibuprofen tablets 3 times a day or 2 over-the-counter naproxen tablets twice a day for pain. Also take tylenol 1000mg (2 extra strength) four times a day.   Return for worsening headache, vomiting confusion.

## 2023-02-07 NOTE — ED Triage Notes (Addendum)
Pt reports that her headboard fell hitting her in the head as she was trying to take it apart around 2000 tonight. Skin intact. Pt also mentions some time later having a nosebleed that she is not currently having. Denies any other sx. Denies hx of epistaxis. Denies LOC or blood thinner use. Ambulatory on arrival.

## 2023-02-07 NOTE — ED Provider Notes (Signed)
[facility] Provider Note   CSN: [id number] Arrival date & time: 02/07/23  4098     History  Chief Complaint  Patient presents with   Head Injury    [name] is a 32 y.o. female.  32 yo F with a complaints of hitting her head.  The patient was trying to take apart her bed frame and the headboard struck her in the head.  She felt a little bit woozy but did not lose consciousness.  Complaining of a headache and left-sided neck pain.  She had epistaxis but this has resolved spontaneously.  She denies any blood thinner use.  She denies one-sided numbness or weakness difficulty speech or swallowing.   Head Injury      Home Medications Prior to Admission medications   Medication Sig Start Date End Date Taking? Authorizing Provider  amLODipine-benazepril (LOTREL) 10-20 MG capsule Take 1 capsule by mouth daily. 02/06/22   Shade Flood, MD  ferrous sulfate (FERROUSUL) 325 (65 FE) MG tablet Take 1 tablet (325 mg total) by mouth 2 (two) times daily. 07/29/21   Janeece Agee, NP  hydrOXYzine (ATARAX) 10 MG tablet Take 10 mg by mouth 3 (three) times daily as needed.    [provider]  norethindrone-ethinyl estradiol-FE (JUNEL FE 1/20) 1-20 MG-MCG tablet Take 1 tablet by mouth daily. 02/12/22   Nche, Bonna Gains, NP  Semaglutide-Weight Management (WEGOVY) 0.5 MG/0.5ML SOAJ Inject 0.5 mg into the skin once a week. Patient not taking: Reported on 04/16/2022 02/18/22   Nche, Bonna Gains, NP  traZODone (DESYREL) 50 MG tablet Take 0.5-1 tablets (25-50 mg total) by mouth at bedtime as needed for sleep. Patient not taking: Reported on 02/12/2022 11/20/21   Janeece Agee, NP  Vitamin D, Ergocalciferol, (DRISDOL) 1.25 MG (50000 UNIT) CAPS capsule Take 1 capsule (50,000 Units total) by mouth every 7 (seven) days. 04/17/22   Nche, Bonna Gains, NP      Allergies    Patient has no known allergies.    Review of Systems   Review  of Systems  Physical Exam Updated Vital Signs BP (!) 148/100   Pulse 97   Resp 20   Ht 5\' 3"  (1.6 m)   Wt 90.7 kg   SpO2 95%   BMI 35.43 kg/m  Physical Exam Vitals and nursing note reviewed.  Constitutional:      General: She is not in acute distress.    Appearance: She is well-developed. She is not diaphoretic.  HENT:     Head: Normocephalic.     Comments: Right frontal hematoma.  No obvious epistaxis or signs of intranasal injury.  No nasal septal hematoma.    Right Ear: Tympanic membrane normal.     Left Ear: Tympanic membrane normal.     Mouth/Throat:     Comments: Pain along the left trapezius.  No midline C-spine tenderness step-offs or deformities.  Able to rotate her head 45 degrees in either direction without discomfort. Eyes:     Pupils: Pupils are equal, round, and reactive to light.  Cardiovascular:     Rate and Rhythm: Normal rate and regular rhythm.     Heart sounds: No murmur heard.    No friction rub. No gallop.  Pulmonary:     Effort: Pulmonary effort is normal.     Breath sounds: No wheezing or rales.  Abdominal:     General: There is no distension.     Palpations: Abdomen is soft.  Tenderness: There is no abdominal tenderness.  Musculoskeletal:        General: No tenderness.     Cervical back: Normal range of motion and neck supple.  Skin:    General: Skin is warm and dry.  Neurological:     Mental Status: She is alert and oriented to person, place, and time.  Psychiatric:        Behavior: Behavior normal.     ED Results / Procedures / Treatments   Labs (all labs ordered are listed, but only abnormal results are displayed) Labs Reviewed - No data to display  EKG None  Radiology No results found.  Procedures Procedures    Medications Ordered in ED Medications - No data to display  ED Course/ Medical Decision Making/ A&P                                 Medical Decision Making  32 yo F with a chief complaints of closed head  injury.  Patient was taking apart her bed frame and the headboard struck her in the head.  This happened about 5 hours ago.  She had some epistaxis which has resolved.  I am able to clear her head by the [place] head CT rules.  She is having some left-sided neck pain, I am able to clear her C-spine by [place] C-spine rules.  Will discharge the patient home.  PCP follow-up.  1:09 AM:  I have discussed the diagnosis/risks/treatment options with the patient and family.  Evaluation and diagnostic testing in the emergency department does not suggest an emergent condition requiring admission or immediate intervention beyond what has been performed at this time.  They will follow up with PCP. We also discussed returning to the ED immediately if new or worsening sx occur. We discussed the sx which are most concerning (e.g., sudden worsening pain, fever, inability to tolerate by mouth, confusion, vomiting) that necessitate immediate return. Medications administered to the patient during their visit and any new prescriptions provided to the patient are listed below.  Medications given during this visit Medications - No data to display   The patient appears reasonably screen and/or stabilized for discharge and I doubt any other medical condition or other [facility] requiring further screening, evaluation, or treatment in the ED at this time prior to discharge.          Final Clinical Impression(s) / ED Diagnoses Final diagnoses:  Closed head injury, initial encounter    Rx / DC Orders ED Discharge Orders     None         Melene Plan, DO 02/07/23 0109

## 2023-09-21 ENCOUNTER — Other Ambulatory Visit: Payer: Self-pay

## 2023-09-21 ENCOUNTER — Emergency Department (HOSPITAL_BASED_OUTPATIENT_CLINIC_OR_DEPARTMENT_OTHER)

## 2023-09-21 ENCOUNTER — Encounter (HOSPITAL_BASED_OUTPATIENT_CLINIC_OR_DEPARTMENT_OTHER): Payer: Self-pay | Admitting: Emergency Medicine

## 2023-09-21 ENCOUNTER — Emergency Department (HOSPITAL_BASED_OUTPATIENT_CLINIC_OR_DEPARTMENT_OTHER)
Admission: EM | Admit: 2023-09-21 | Discharge: 2023-09-22 | Disposition: A | Discharge status: Home / Self Care | Attending: Emergency Medicine | Admitting: Emergency Medicine

## 2023-09-21 DIAGNOSIS — R1010 Upper abdominal pain, unspecified: Secondary | ICD-10-CM | POA: Diagnosis present

## 2023-09-21 DIAGNOSIS — K529 Noninfective gastroenteritis and colitis, unspecified: Secondary | ICD-10-CM | POA: Insufficient documentation

## 2023-09-21 LAB — COMPREHENSIVE METABOLIC PANEL WITH GFR
ALT: 12 U/L (ref 0–44)
AST: 24 U/L (ref 15–41)
Albumin: 4.6 g/dL (ref 3.5–5.0)
Alkaline Phosphatase: 70 U/L (ref 38–126)
Anion gap: 15 (ref 5–15)
BUN: 7 mg/dL (ref 6–20)
CO2: 22 mmol/L (ref 22–32)
Calcium: 8.9 mg/dL (ref 8.9–10.3)
Chloride: 96 mmol/L — ABNORMAL LOW (ref 98–111)
Creatinine, Ser: 0.46 mg/dL (ref 0.44–1.00)
GFR, Estimated: >60 mL/min (ref >60)
Glucose, Bld: 112 mg/dL — ABNORMAL HIGH (ref 70–99)
Potassium: 3.5 mmol/L (ref 3.5–5.1)
Sodium: 133 mmol/L — ABNORMAL LOW (ref 135–145)
Total Bilirubin: 0.5 mg/dL (ref 0.0–1.2)
Total Protein: 8.2 g/dL — ABNORMAL HIGH (ref 6.5–8.1)

## 2023-09-21 LAB — URINALYSIS, ROUTINE W REFLEX MICROSCOPIC
Bilirubin Urine: NEGATIVE
Glucose, UA: NEGATIVE mg/dL
Ketones, ur: 80 mg/dL — AB
Leukocytes,Ua: NEGATIVE
Nitrite: NEGATIVE
Protein, ur: 30 mg/dL — AB
Specific Gravity, Urine: 1.02 (ref 1.005–1.030)
pH: 7 (ref 5.0–8.0)

## 2023-09-21 LAB — CBC
HCT: 37.7 % (ref 36.0–46.0)
Hemoglobin: 11.3 g/dL — ABNORMAL LOW (ref 12.0–15.0)
MCH: 18.6 pg — ABNORMAL LOW (ref 26.0–34.0)
MCHC: 30 g/dL (ref 30.0–36.0)
MCV: 62.2 fL — ABNORMAL LOW (ref 80.0–100.0)
Platelets: 486 10*3/uL — ABNORMAL HIGH (ref 150–400)
RBC: 6.06 MIL/uL — ABNORMAL HIGH (ref 3.87–5.11)
RDW: 21.6 % — ABNORMAL HIGH (ref 11.5–15.5)
WBC: 9.9 10*3/uL (ref 4.0–10.5)
nRBC: 0 % (ref 0.0–0.2)

## 2023-09-21 LAB — URINALYSIS, MICROSCOPIC (REFLEX): RBC / HPF: >50 RBC/hpf (ref 0–5)

## 2023-09-21 LAB — PREGNANCY, URINE: Preg Test, Ur: NEGATIVE

## 2023-09-21 LAB — LIPASE, BLOOD: Lipase: 20 U/L (ref 11–51)

## 2023-09-21 MED ORDER — ONDANSETRON HCL 4 MG/2ML IJ SOLN
4.0000 mg | Freq: Once | INTRAMUSCULAR | Status: AC
  Administered 2023-09-21: 4 mg via INTRAVENOUS
  Filled 2023-09-21: qty 2

## 2023-09-21 MED ORDER — FENTANYL CITRATE PF 50 MCG/ML IJ SOSY
50.0000 ug | PREFILLED_SYRINGE | Freq: Once | INTRAMUSCULAR | Status: AC
  Administered 2023-09-21: 50 ug via INTRAVENOUS
  Filled 2023-09-21: qty 1

## 2023-09-21 MED ORDER — SODIUM CHLORIDE 0.9 % IV BOLUS
1000.0000 mL | Freq: Once | INTRAVENOUS | Status: AC
  Administered 2023-09-21: 1000 mL via INTRAVENOUS

## 2023-09-21 MED ORDER — IOHEXOL 300 MG/ML  SOLN
100.0000 mL | Freq: Once | INTRAMUSCULAR | Status: AC | PRN
  Administered 2023-09-21: 100 mL via INTRAVENOUS

## 2023-09-21 NOTE — ED Triage Notes (Signed)
 Pt via EMS from dr office where pt was being evaluated for n/v/abd pain x 2 days. LMP a month ago, denies concern of pregnancy. No rebound tenderness but pt had pain and guarding on palpation. Pain currently rated 10/10 constant, mostly epigastric area. Pt ambulatory in NAD at time of arrival. EMS notes pt went to two doctor offices and was sent to the ER twice today, LWBS due to wait time, before calling EMS this evening.  BP 170/100 HR 94 RR 18 O2 100% RA CBG 121

## 2023-09-21 NOTE — ED Provider Notes (Signed)
  EMERGENCY DEPARTMENT AT [facility] Provider Note   CSN: [id number] Arrival date & time: 09/21/23  1802     History  Chief Complaint  Patient presents with   Abdominal Pain    [name] is a 32 y.o. female with upper abdominal pain primarily along with nausea and vomiting that has been present over the past 48 hours.  Further she endorses a decreased appetite over the last 24 hours.  She endorses diffuse pain across the abdomen and states that she attempted to go to work but vomited and also has increased general malaise.  Her primary reason for presenting today was the increased nausea, along with the generalized abdominal pain.  She denies any diarrhea, denies any constipation, states her last bowel movement was this previous Sunday.  With vomiting, she denies any bright red or coffee-ground emesis.   Abdominal Pain Associated symptoms: fatigue, fever, nausea and vomiting   Associated symptoms: no constipation and no diarrhea        Home Medications Prior to Admission medications   Medication Sig Start Date End Date Taking? Authorizing Provider  amLODipine -benazepril  (LOTREL) 10-20 MG capsule Take 1 capsule by mouth daily. 02/06/22   Benjiman Bras, MD  ferrous sulfate  (FERROUSUL) 325 (65 FE) MG tablet Take 1 tablet (325 mg total) by mouth 2 (two) times daily. 07/29/21   Ulyess Gammons, NP  hydrOXYzine  (ATARAX ) 10 MG tablet Take 10 mg by mouth 3 (three) times daily as needed.    [provider]  norethindrone -ethinyl estradiol -FE (JUNEL  FE 1/20) 1-20 MG-MCG tablet Take 1 tablet by mouth daily. 02/12/22   Nche, Connye Delaine, NP  Semaglutide -Weight Management (WEGOVY ) 0.5 MG/0.5ML SOAJ Inject 0.5 mg into the skin once a week. Patient not taking: Reported on 04/16/2022 02/18/22   Nche, Connye Delaine, NP  traZODone  (DESYREL ) 50 MG tablet Take 0.5-1 tablets (25-50 mg total) by mouth at bedtime as needed for sleep. Patient not taking: Reported on  02/12/2022 11/20/21   Ulyess Gammons, NP  Vitamin D , Ergocalciferol , (DRISDOL ) 1.25 MG (50000 UNIT) CAPS capsule Take 1 capsule (50,000 Units total) by mouth every 7 (seven) days. 04/17/22   Nche, Connye Delaine, NP      Allergies    Patient has no known allergies.    Review of Systems   Review of Systems  Constitutional:  Positive for appetite change, fatigue and fever.  Gastrointestinal:  Positive for abdominal pain, nausea and vomiting. Negative for abdominal distention, blood in stool, constipation and diarrhea.  Neurological:  Negative for dizziness, weakness and light-headedness.  All other systems reviewed and are negative.   Physical Exam Updated Vital Signs BP (!) 146/92   Pulse 92   Temp 98.8 F (37.1 C) (Oral)   Resp 19   Ht 5\' 3"  (1.6 m)   Wt 94.8 kg   LMP 08/26/2023 (Exact Date)   SpO2 98%   BMI 37.02 kg/m  Physical Exam Vitals and nursing note reviewed.  Constitutional:      General: She is not in acute distress.    Appearance: Normal appearance. She is well-developed. She is not ill-appearing.  HENT:     Head: Normocephalic and atraumatic.     Mouth/Throat:     Mouth: Mucous membranes are moist.     Pharynx: Oropharynx is clear.  Eyes:     Extraocular Movements: Extraocular movements intact.     Conjunctiva/sclera: Conjunctivae normal.     Pupils: Pupils are equal, round, and reactive to light.  Cardiovascular:  Rate and Rhythm: Normal rate and regular rhythm.     Pulses: Normal pulses.     Heart sounds: Normal heart sounds. No murmur heard.    No friction rub. No gallop.  Pulmonary:     Effort: Pulmonary effort is normal.     Breath sounds: Normal breath sounds.  Abdominal:     General: Abdomen is flat. Bowel sounds are normal. There is no distension or abdominal bruit.     Palpations: Abdomen is soft.     Tenderness: There is abdominal tenderness in the right upper quadrant and left upper quadrant. There is no right CVA tenderness or left CVA  tenderness. Negative signs include Murphy's sign and McBurney's sign.  Musculoskeletal:        General: Normal range of motion.     Cervical back: Normal range of motion and neck supple.     Right lower leg: No edema.     Left lower leg: No edema.  Skin:    General: Skin is warm and dry.     Capillary Refill: Capillary refill takes less than 2 seconds.  Neurological:     General: No focal deficit present.     Mental Status: She is alert. Mental status is at baseline.  Psychiatric:        Mood and Affect: Mood normal.     ED Results / Procedures / Treatments   Labs (all labs ordered are listed, but only abnormal results are displayed) Labs Reviewed  COMPREHENSIVE METABOLIC PANEL WITH GFR - Abnormal; Notable for the following components:      Result Value   Sodium 133 (*)    Chloride 96 (*)    Glucose, Bld 112 (*)    Total Protein 8.2 (*)    All other components within normal limits  CBC - Abnormal; Notable for the following components:   RBC 6.06 (*)    Hemoglobin 11.3 (*)    MCV 62.2 (*)    MCH 18.6 (*)    RDW 21.6 (*)    Platelets 486 (*)    All other components within normal limits  URINALYSIS, ROUTINE W REFLEX MICROSCOPIC - Abnormal; Notable for the following components:   Color, Urine BROWN (*)    APPearance TURBID (*)    Hgb urine dipstick LARGE (*)    Ketones, ur 80 (*)    Protein, ur 30 (*)    All other components within normal limits  URINALYSIS, MICROSCOPIC (REFLEX) - Abnormal; Notable for the following components:   Bacteria, UA FEW (*)    All other components within normal limits  LIPASE, BLOOD  PREGNANCY, URINE    EKG None  Radiology CT ABDOMEN PELVIS W CONTRAST Result Date: 09/21/2023 CLINICAL DATA:  Abdominal pain for 2 days EXAM: CT ABDOMEN AND PELVIS WITH CONTRAST TECHNIQUE: Multidetector CT imaging of the abdomen and pelvis was performed using the standard protocol following bolus administration of intravenous contrast. RADIATION DOSE REDUCTION:  This exam was performed according to the departmental dose-optimization program which includes automated exposure control, adjustment of the mA and/or kV according to patient size and/or use of iterative reconstruction technique. CONTRAST:  100mL OMNIPAQUE IOHEXOL 300 MG/ML  SOLN COMPARISON:  None Available. FINDINGS: Lower chest: No acute abnormality. Hepatobiliary: Fatty infiltration of the liver is noted. The gallbladder is within normal limits. Mild perihepatic fluid is seen. Pancreas: Pancreas is well visualized and within normal limits. Spleen: Spleen is unremarkable. A mild amount perisplenic fluid is noted. Adrenals/Urinary Tract: Adrenal glands are within  normal limits. Kidneys demonstrate a normal enhancement pattern bilaterally. No renal calculi or obstructive changes are seen. The bladder is within normal limits. Stomach/Bowel: No obstructive or inflammatory changes of the colon are noted. The appendix is within normal limits. Stomach is decompressed. The small bowel shows no obstructive change although in the left mid abdomen there are multiple inflamed loops of small bowel identified likely related to enteritis. Surrounding free fluid is noted. No perforation or obstruction is noted. Vascular/Lymphatic: No significant vascular findings are present. No enlarged abdominal or pelvic lymph nodes. Reproductive: Uterus and bilateral adnexa are unremarkable. Other: Free fluid is noted within the abdomen surrounding the liver and spleen as well multiple plain film small-bowel. No evidence of perforation is noted. The fluid is likely reactive in nature to the underlying inflammatory change. Musculoskeletal: No acute or significant osseous findings. IMPRESSION: Multiple inflamed loops of small bowel in the left mid abdomen with associated free fluid. No perforation or obstruction is noted. These changes are likely related to small bowel enteritis. Electronically Signed   By: Violeta Grey M.D.   On: 09/21/2023  22:01    Procedures Procedures    Medications Ordered in ED Medications  ondansetron [facility]) injection 4 mg (4 mg Intravenous Given 09/21/23 2107)  fentaNYL (SUBLIMAZE) injection 50 mcg (50 mcg Intravenous Given 09/21/23 2107)  iohexol (OMNIPAQUE) 300 MG/ML solution 100 mL (100 mLs Intravenous Contrast Given 09/21/23 2136)  sodium chloride  0.9 % bolus 1,000 mL (1,000 mLs Intravenous New Bag/Given 09/21/23 2331)    ED Course/ Medical Decision Making/ A&P                                 Medical Decision Making Amount and/or Complexity of Data Reviewed Labs: ordered. Radiology: ordered.  Risk Prescription drug management.   Medical Decision Making:   [name] is a 33 y.o. female who presented to the ED today with abdominal pain detailed above.     Complete initial physical exam performed, notably the patient  was alert and oriented in no apparent distress but visibly uncomfortable.  Abdomen is diffusely tender with specific point tenderness to the right upper quadrant, as well as the left upper quadrant in the  hypochondrium.  Abdomen appears flat and nondistended, bowel sounds are present and normal.  Reviewed and confirmed nursing documentation for past medical history, family history, social history.    Initial Assessment:   With the patient's presentation of abdominal pain, most likely diagnosis is gastroenteritis. Other diagnoses were considered including (but not limited to) pancreatitis, biliary disease, bowel obstruction. These are considered less likely due to history of present illness and physical exam findings.     Initial Plan:  Obtain CT imaging of the abdomen to evaluate for acute abdominal pathology Screening labs including CBC and Metabolic panel to evaluate for infectious or metabolic etiology of disease.  Also obtain serum lipase Urinalysis with reflex culture ordered to evaluate for UTI or relevant urologic/nephrologic pathology.  Manage pain with IV  fentanyl and manage nausea with IV Zofran. Obtain EKG to rule out acute coronary pathology Objective evaluation as below reviewed   Initial Study Results:   Laboratory  All laboratory results reviewed without evidence of clinically relevant pathology.   Exceptions include: Brown turbid urine noted on routine UA, reflex showed presence of RBCs as well as few bacteria.  EKG EKG was reviewed independently. Rate, rhythm, axis, intervals all examined and without medically relevant  abnormality. ST segments without concerns for elevations.    Radiology:  All images reviewed independently. Agree with radiology report at this time.   CT ABDOMEN PELVIS W CONTRAST Result Date: 09/21/2023 CLINICAL DATA:  Abdominal pain for 2 days EXAM: CT ABDOMEN AND PELVIS WITH CONTRAST TECHNIQUE: Multidetector CT imaging of the abdomen and pelvis was performed using the standard protocol following bolus administration of intravenous contrast. RADIATION DOSE REDUCTION: This exam was performed according to the departmental dose-optimization program which includes automated exposure control, adjustment of the mA and/or kV according to patient size and/or use of iterative reconstruction technique. CONTRAST:  100mL OMNIPAQUE IOHEXOL 300 MG/ML  SOLN COMPARISON:  None Available. FINDINGS: Lower chest: No acute abnormality. Hepatobiliary: Fatty infiltration of the liver is noted. The gallbladder is within normal limits. Mild perihepatic fluid is seen. Pancreas: Pancreas is well visualized and within normal limits. Spleen: Spleen is unremarkable. A mild amount perisplenic fluid is noted. Adrenals/Urinary Tract: Adrenal glands are within normal limits. Kidneys demonstrate a normal enhancement pattern bilaterally. No renal calculi or obstructive changes are seen. The bladder is within normal limits. Stomach/Bowel: No obstructive or inflammatory changes of the colon are noted. The appendix is within normal limits. Stomach is decompressed.  The small bowel shows no obstructive change although in the left mid abdomen there are multiple inflamed loops of small bowel identified likely related to enteritis. Surrounding free fluid is noted. No perforation or obstruction is noted. Vascular/Lymphatic: No significant vascular findings are present. No enlarged abdominal or pelvic lymph nodes. Reproductive: Uterus and bilateral adnexa are unremarkable. Other: Free fluid is noted within the abdomen surrounding the liver and spleen as well multiple plain film small-bowel. No evidence of perforation is noted. The fluid is likely reactive in nature to the underlying inflammatory change. Musculoskeletal: No acute or significant osseous findings. IMPRESSION: Multiple inflamed loops of small bowel in the left mid abdomen with associated free fluid. No perforation or obstruction is noted. These changes are likely related to small bowel enteritis. Electronically Signed   By: Violeta Grey M.D.   On: 09/21/2023 22:01    Reassessment and Plan:   Along with lab workup, physical exam findings, and CT imaging results findings are consistent with acute enteritis.  Will continue to manage nausea with Zofran, further manage abdominal pain with opiate pain management as noted.          Final Clinical Impression(s) / ED Diagnoses Final diagnoses:  None    Rx / DC Orders ED Discharge Orders     None         Juanetta Nordmann, PA 09/22/23 0006    Rosealee Concha, MD 09/22/23 [phone]

## 2023-09-22 MED ORDER — FENTANYL CITRATE PF 50 MCG/ML IJ SOSY
50.0000 ug | PREFILLED_SYRINGE | Freq: Once | INTRAMUSCULAR | Status: AC
  Administered 2023-09-22: 50 ug via INTRAVENOUS
  Filled 2023-09-22: qty 1

## 2023-09-22 MED ORDER — ONDANSETRON HCL 4 MG PO TABS: 4.0000 mg | ORAL_TABLET | Freq: Four times a day (QID) | ORAL | 0 refills | Status: AC

## 2023-09-22 MED ORDER — HYDROCODONE-ACETAMINOPHEN 5-325 MG PO TABS: 2.0000 | ORAL_TABLET | ORAL | 0 refills | Status: AC | PRN

## 2023-09-22 MED ORDER — FUROSEMIDE 10 MG/ML IJ SOLN: 40.0000 mg | Freq: Once | INTRAMUSCULAR | Status: DC

## 2023-09-22 NOTE — Discharge Instructions (Signed)
 History discussed, you have been prescribed nausea medication that he can use as needed.  Further he has been prescribed pain medication that he can use as needed, would encourage you to use Tylenol as needed for pain and only use the prescribed medication as needed if the pain not managed with Tylenol.  Further as we discussed strongly encourage you to drink increased amounts of oral fluids to prevent yourself becoming dehydrated.  You can further begin to eat a clear diet over the next 24 hours as we talked about and have over the next several days increased food as you tolerate.
